# Patient Record
Sex: Female | Born: 1990
Health system: Southern US, Community
[De-identification: ages and names within clinical notes are randomized; demographics above are authoritative.]

## PROBLEM LIST (undated history)

## (undated) DIAGNOSIS — F419 Anxiety disorder, unspecified: Secondary | ICD-10-CM

## (undated) DIAGNOSIS — K219 Gastro-esophageal reflux disease without esophagitis: Secondary | ICD-10-CM

## (undated) DIAGNOSIS — N96 Recurrent pregnancy loss: Secondary | ICD-10-CM

## (undated) DIAGNOSIS — F32A Depression, unspecified: Secondary | ICD-10-CM

## (undated) DIAGNOSIS — Z8619 Personal history of other infectious and parasitic diseases: Secondary | ICD-10-CM

## (undated) DIAGNOSIS — N939 Abnormal uterine and vaginal bleeding, unspecified: Secondary | ICD-10-CM

## (undated) DIAGNOSIS — F329 Major depressive disorder, single episode, unspecified: Secondary | ICD-10-CM

## (undated) DIAGNOSIS — Z9109 Other allergy status, other than to drugs and biological substances: Secondary | ICD-10-CM

## (undated) DIAGNOSIS — F909 Attention-deficit hyperactivity disorder, unspecified type: Secondary | ICD-10-CM

## (undated) HISTORY — DX: Personal history of other infectious and parasitic diseases: Z86.19

## (undated) HISTORY — DX: Major depressive disorder, single episode, unspecified: F32.9

## (undated) HISTORY — DX: Other allergy status, other than to drugs and biological substances: Z91.09

## (undated) HISTORY — DX: Depression, unspecified: F32.A

## (undated) HISTORY — DX: Anxiety disorder, unspecified: F41.9

## (undated) HISTORY — DX: Recurrent pregnancy loss: N96

## (undated) HISTORY — DX: Attention-deficit hyperactivity disorder, unspecified type: F90.9

## (undated) HISTORY — DX: Gastro-esophageal reflux disease without esophagitis: K21.9

## (undated) HISTORY — PX: TONSILLECTOMY: SUR1361

## (undated) HISTORY — DX: Abnormal uterine and vaginal bleeding, unspecified: N93.9

---
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
## 2008-02-11 ENCOUNTER — Encounter: Payer: Self-pay | Admitting: Maternal & Fetal Medicine

## 2008-03-17 ENCOUNTER — Encounter: Payer: Self-pay | Admitting: Maternal & Fetal Medicine

## 2008-04-27 ENCOUNTER — Inpatient Hospital Stay: Payer: Self-pay | Admitting: Obstetrics and Gynecology

## 2008-05-21 ENCOUNTER — Other Ambulatory Visit: Payer: Self-pay

## 2008-05-21 ENCOUNTER — Emergency Department: Payer: Self-pay | Admitting: Emergency Medicine

## 2009-09-19 ENCOUNTER — Emergency Department: Payer: Self-pay | Admitting: Emergency Medicine

## 2009-11-29 ENCOUNTER — Emergency Department: Payer: Self-pay

## 2011-08-25 ENCOUNTER — Emergency Department: Payer: Self-pay | Admitting: Emergency Medicine

## 2015-02-11 ENCOUNTER — Emergency Department
Admit: 2015-02-11 | Disposition: A | Discharge status: Home / Self Care | Payer: Self-pay | Admitting: Emergency Medicine

## 2015-02-11 LAB — CBC
HCT: 38.3 % (ref 35.0–47.0)
HGB: 12.6 g/dL (ref 12.0–16.0)
MCH: 28.1 pg (ref 26.0–34.0)
MCHC: 33 g/dL (ref 32.0–36.0)
MCV: 85 fL (ref 80–100)
Platelet: 301 10*3/uL (ref 150–440)
RBC: 4.5 10*6/uL (ref 3.80–5.20)
RDW: 13 % (ref 11.5–14.5)
WBC: 9.7 10*3/uL (ref 3.6–11.0)

## 2015-02-11 LAB — HCG, QUANTITATIVE, PREGNANCY: Beta Hcg, Quant.: 8 m[IU]/mL — ABNORMAL HIGH

## 2015-06-29 ENCOUNTER — Ambulatory Visit (INDEPENDENT_AMBULATORY_CARE_PROVIDER_SITE_OTHER): Payer: Medicaid Other | Admitting: Obstetrics and Gynecology

## 2015-06-29 VITALS — BP 116/76 | HR 75 | Ht 66.0 in | Wt 230.4 lb

## 2015-06-29 DIAGNOSIS — Z331 Pregnant state, incidental: Secondary | ICD-10-CM

## 2015-06-29 DIAGNOSIS — N96 Recurrent pregnancy loss: Secondary | ICD-10-CM

## 2015-06-29 DIAGNOSIS — Z36 Encounter for antenatal screening of mother: Secondary | ICD-10-CM

## 2015-06-29 DIAGNOSIS — Z1389 Encounter for screening for other disorder: Secondary | ICD-10-CM

## 2015-06-29 DIAGNOSIS — K219 Gastro-esophageal reflux disease without esophagitis: Secondary | ICD-10-CM | POA: Insufficient documentation

## 2015-06-29 DIAGNOSIS — R110 Nausea: Secondary | ICD-10-CM

## 2015-06-29 DIAGNOSIS — N898 Other specified noninflammatory disorders of vagina: Secondary | ICD-10-CM

## 2015-06-29 DIAGNOSIS — Z113 Encounter for screening for infections with a predominantly sexual mode of transmission: Secondary | ICD-10-CM

## 2015-06-29 DIAGNOSIS — R638 Other symptoms and signs concerning food and fluid intake: Secondary | ICD-10-CM

## 2015-06-29 DIAGNOSIS — N939 Abnormal uterine and vaginal bleeding, unspecified: Secondary | ICD-10-CM

## 2015-06-29 DIAGNOSIS — Z369 Encounter for antenatal screening, unspecified: Secondary | ICD-10-CM

## 2015-06-29 DIAGNOSIS — Z8619 Personal history of other infectious and parasitic diseases: Secondary | ICD-10-CM

## 2015-06-29 MED ORDER — DOXYLAMINE-PYRIDOXINE 10-10 MG PO TBEC: DELAYED_RELEASE_TABLET | ORAL | Status: DC

## 2015-06-29 MED ORDER — CONCEPT DHA 53.5-38-1 MG PO CAPS
1.0000 | ORAL_CAPSULE | Freq: Every day | ORAL | Status: DC
Start: 2015-06-29 — End: 2015-07-16

## 2015-06-29 NOTE — Progress Notes (Signed)
Brandy Becker for NOB nurse interview visit. G-4.  P-1021. Pregnancy confirmation pt brought from [facility], UPT.  EDD 02/18/2016, EGA 6.4 wks.  Pregnancy eduction material explained and given. No cats in the home. NOB labs ordered. TSH/HbgA1c due to Increased BMI. HIV lab and Drug screen pt was aware she could opt out and did not decline. Drug screen ordered and sent to lab. PNV encouraged and ordered. Diclegis ordered for nausea, no bad as yet. NT to discuss with provider but would like genetic testing. Ultrasound ordered for tomorrow due pt having spotting x5 days, hx of miscarriages x2, and had ultrasound at [facility]. Also BHCG ordered due to aub.  Pt not sure but she thought no gestional sac was seen on 06/25/2015. Also pt states she had a glucose test and other labs but does not know what they were, that had to call the doctor and find out what she was supposed to get.  Pt to sign medical release but information will not be available when needed. Pt. To follow up with provider pending ultrasound results for NOB physical.  All questions answered. Pt's had +home pregnancy test (had been trying to conceive) and confirmed at [facility] on 06/18/2015. LMP 04/28/2015. Started spotting orange in color when wiped on 06/24/2015, 06/25/15 spotting and [place] for dating, 06/26/15 no bleeding, 06/27/15 bled thru panties, red, not dark red (at a cook out); 06/28/2015 spotted x2 and today.   ZIKA EXPOSURE SCREEN:  The patient has not traveled to a [place] Virus endemic area within the past 6 months, nor has she had unprotected sex with a partner who has travelled to a [place] endemic region within the past 6 months. The patient has been advised to notify us if these factors change any time during this current pregnancy, so adequate testing and monitoring can be initiated.

## 2015-06-30 ENCOUNTER — Encounter (INDEPENDENT_AMBULATORY_CARE_PROVIDER_SITE_OTHER): Payer: Self-pay

## 2015-06-30 ENCOUNTER — Ambulatory Visit: Payer: Medicaid Other

## 2015-06-30 ENCOUNTER — Other Ambulatory Visit: Payer: Medicaid Other

## 2015-06-30 ENCOUNTER — Other Ambulatory Visit: Payer: Self-pay | Admitting: Obstetrics and Gynecology

## 2015-06-30 DIAGNOSIS — N939 Abnormal uterine and vaginal bleeding, unspecified: Secondary | ICD-10-CM

## 2015-06-30 DIAGNOSIS — Z331 Pregnant state, incidental: Secondary | ICD-10-CM | POA: Diagnosis not present

## 2015-06-30 DIAGNOSIS — Z36 Encounter for antenatal screening of mother: Secondary | ICD-10-CM | POA: Diagnosis not present

## 2015-06-30 DIAGNOSIS — Z369 Encounter for antenatal screening, unspecified: Secondary | ICD-10-CM

## 2015-06-30 DIAGNOSIS — N898 Other specified noninflammatory disorders of vagina: Secondary | ICD-10-CM | POA: Diagnosis not present

## 2015-06-30 DIAGNOSIS — N96 Recurrent pregnancy loss: Secondary | ICD-10-CM

## 2015-06-30 NOTE — Progress Notes (Unsigned)
Patient ID: Brandy Becker, female   DOB: 08-10-1991, 24 y.o.   MRN: [id number] Pt informed that ultrasound indicated she was 5.3 wks today. Sac empty. She should me a picture of US done at [facility] which states she was 8.2 wks on 8/11/14/14 not 6 wks. As previously told. Dr. Valentino Saxon ordered another bhcg for Thurs. And f/u appt on Tuesday of next week. I will call pt will lab (bhcg) on Friday due to pt not being able to come until afternoon on thurs for lab. Pt states that vaginal spotting has not changed. Is having headaches. To take Tylenol ES as directed.  Contact number for pt.: [phone]

## 2015-07-01 LAB — HEMOGLOBIN A1C
Est. average glucose Bld gHb Est-mCnc: 114 mg/dL
HEMOGLOBIN A1C: 5.6 % (ref 4.8–5.6)

## 2015-07-01 LAB — CBC WITH DIFFERENTIAL/PLATELET
Basophils Absolute: 0.1 10*3/uL (ref 0.0–0.2)
Basos: 1 %
EOS (ABSOLUTE): 0.1 10*3/uL (ref 0.0–0.4)
EOS: 1 %
HEMATOCRIT: 37.3 % (ref 34.0–46.6)
Hemoglobin: 12.5 g/dL (ref 11.1–15.9)
IMMATURE GRANULOCYTES: 0 %
Immature Grans (Abs): 0 10*3/uL (ref 0.0–0.1)
Lymphocytes Absolute: 2.9 10*3/uL (ref 0.7–3.1)
Lymphs: 36 %
MCH: 28.6 pg (ref 26.6–33.0)
MCHC: 33.5 g/dL (ref 31.5–35.7)
MCV: 85 fL (ref 79–97)
Monocytes Absolute: 0.4 10*3/uL (ref 0.1–0.9)
Monocytes: 5 %
Neutrophils Absolute: 4.6 10*3/uL (ref 1.4–7.0)
Neutrophils: 57 %
PLATELETS: 310 10*3/uL (ref 150–379)
RBC: 4.37 x10E6/uL (ref 3.77–5.28)
RDW: 13.4 % (ref 12.3–15.4)
WBC: 8 10*3/uL (ref 3.4–10.8)

## 2015-07-01 LAB — URINALYSIS, ROUTINE W REFLEX MICROSCOPIC
Bilirubin, UA: NEGATIVE
GLUCOSE, UA: NEGATIVE
Ketones, UA: NEGATIVE
Leukocytes, UA: NEGATIVE
Nitrite, UA: NEGATIVE
PROTEIN UA: NEGATIVE
RBC, UA: NEGATIVE
SPEC GRAV UA: 1.027 (ref 1.005–1.030)
UUROB: 0.2 mg/dL (ref 0.2–1.0)
pH, UA: 6 (ref 5.0–7.5)

## 2015-07-01 LAB — URINE CULTURE: ORGANISM ID, BACTERIA: NO GROWTH

## 2015-07-01 LAB — VARICELLA ZOSTER ANTIBODY, IGM: Varicella IgM: 1.14 index — ABNORMAL HIGH (ref 0.00–0.90)

## 2015-07-01 LAB — TSH: TSH: 1.9 u[IU]/mL (ref 0.450–4.500)

## 2015-07-01 LAB — GC/CHLAMYDIA PROBE AMP
Chlamydia trachomatis, NAA: NEGATIVE
Neisseria gonorrhoeae by PCR: NEGATIVE

## 2015-07-01 LAB — HIV ANTIBODY (ROUTINE TESTING W REFLEX): HIV SCREEN 4TH GENERATION: NONREACTIVE

## 2015-07-01 LAB — HEPATITIS B SURFACE ANTIGEN: Hepatitis B Surface Ag: NEGATIVE

## 2015-07-01 LAB — ANTIBODY SCREEN: ANTIBODY SCREEN: NEGATIVE

## 2015-07-01 LAB — RH TYPE: Rh Factor: POSITIVE

## 2015-07-01 LAB — ABO

## 2015-07-01 LAB — RPR: RPR Ser Ql: NONREACTIVE

## 2015-07-01 LAB — RUBELLA ANTIBODY, IGM

## 2015-07-02 ENCOUNTER — Other Ambulatory Visit: Payer: Self-pay | Admitting: Obstetrics and Gynecology

## 2015-07-02 ENCOUNTER — Other Ambulatory Visit: Payer: Self-pay

## 2015-07-02 ENCOUNTER — Other Ambulatory Visit: Payer: Medicaid Other

## 2015-07-02 DIAGNOSIS — N939 Abnormal uterine and vaginal bleeding, unspecified: Secondary | ICD-10-CM

## 2015-07-02 DIAGNOSIS — N96 Recurrent pregnancy loss: Secondary | ICD-10-CM

## 2015-07-02 DIAGNOSIS — Z331 Pregnant state, incidental: Secondary | ICD-10-CM

## 2015-07-02 LAB — BETA HCG QUANT (REF LAB): hCG Quant: 10919 m[IU]/mL

## 2015-07-02 LAB — SPECIMEN STATUS REPORT

## 2015-07-03 ENCOUNTER — Telehealth: Payer: Self-pay

## 2015-07-03 LAB — BETA HCG QUANT (REF LAB): hCG Quant: 7838 m[IU]/mL

## 2015-07-03 NOTE — Telephone Encounter (Signed)
-----   Message from Anika Cherry, MD sent at 07/03/2015 12:42 PM EDT ----- Please inform patient that her BHCG levels are declining.  Not a normal pregnancy.  Needs to see Dr. D for further discussion of management options. 

## 2015-07-03 NOTE — Telephone Encounter (Signed)
Pt aware. Appt made 8/31 at 11:30.

## 2015-07-03 NOTE — Telephone Encounter (Signed)
-----   Message from Hildred Laser, MD sent at 07/03/2015 12:42 PM EDT ----- Please inform patient that her BHCG levels are declining.  Not a normal pregnancy.  Needs to see Dr. Algis Downs for further discussion of management options.

## 2015-07-03 NOTE — Telephone Encounter (Signed)
Mailbox full, unable to leave msg to contact office.

## 2015-07-07 ENCOUNTER — Encounter: Payer: Self-pay | Admitting: Obstetrics and Gynecology

## 2015-07-07 ENCOUNTER — Ambulatory Visit (INDEPENDENT_AMBULATORY_CARE_PROVIDER_SITE_OTHER): Payer: Medicaid Other | Admitting: Obstetrics and Gynecology

## 2015-07-07 VITALS — BP 117/73 | HR 77 | Ht 65.0 in | Wt 227.2 lb

## 2015-07-07 DIAGNOSIS — N96 Recurrent pregnancy loss: Secondary | ICD-10-CM | POA: Diagnosis not present

## 2015-07-07 DIAGNOSIS — O200 Threatened abortion: Secondary | ICD-10-CM

## 2015-07-07 NOTE — Progress Notes (Signed)
Chief complaint: 1.  Abnormal first trimester pregnancy.  Patient is a 24 year old gravida 4, para 0121 at 7.[redacted] weeks gestation who presents for follow-up regarding possible an- embryonic gestation. Early ultrasound on 06/25/2015 at [facility] demonstrated an early gestational sac without fetal pole. Subsequent follow-up ultrasound here on 06/30/2015.  Likewise demonstrated a gestational sac without fetal pole or yolk sac. Quantitative ACG titers are on a downward trend. Patient is experiencing mild cramping and spotting without passage of tissue.  IMPRESSION: 1.  Threatened AB, probable an embryonic gestation.  PLAN: 1.  Repeat ultrasound to confirm findings. 2.  If there is no development of an intrauterine pregnancy on ultrasound, definitive management will be performed.  Patient is requesting D&C if the pregnancy is nonviable.  A total of 15 minutes were spent face-to-face with the patient during this encounter and over half of that time dealt with counseling and coordination of care.  [name], MD

## 2015-07-08 ENCOUNTER — Ambulatory Visit: Payer: Medicaid Other | Admitting: Anesthesiology

## 2015-07-08 ENCOUNTER — Ambulatory Visit
Admission: AD | Admit: 2015-07-08 | Discharge: 2015-07-08 | Disposition: A | Discharge status: Home / Self Care | Payer: Medicaid Other | Source: Ambulatory Visit | Attending: Obstetrics and Gynecology | Admitting: Obstetrics and Gynecology

## 2015-07-08 ENCOUNTER — Ambulatory Visit: Payer: Medicaid Other

## 2015-07-08 ENCOUNTER — Encounter: Payer: Self-pay | Admitting: *Deleted

## 2015-07-08 ENCOUNTER — Encounter
Admission: AD | Disposition: A | Discharge status: Home / Self Care | Payer: Self-pay | Source: Ambulatory Visit | Attending: Obstetrics and Gynecology

## 2015-07-08 DIAGNOSIS — Z3A08 8 weeks gestation of pregnancy: Secondary | ICD-10-CM | POA: Diagnosis not present

## 2015-07-08 DIAGNOSIS — Z87891 Personal history of nicotine dependence: Secondary | ICD-10-CM | POA: Diagnosis not present

## 2015-07-08 DIAGNOSIS — K219 Gastro-esophageal reflux disease without esophagitis: Secondary | ICD-10-CM | POA: Insufficient documentation

## 2015-07-08 DIAGNOSIS — O021 Missed abortion: Secondary | ICD-10-CM | POA: Diagnosis not present

## 2015-07-08 DIAGNOSIS — O200 Threatened abortion: Secondary | ICD-10-CM | POA: Diagnosis not present

## 2015-07-08 HISTORY — PX: DILATION AND EVACUATION: SHX1459

## 2015-07-08 LAB — CBC WITH DIFFERENTIAL/PLATELET
BASOS ABS: 0.1 10*3/uL (ref 0–0.1)
BASOS PCT: 1 %
Eosinophils Absolute: 0.1 10*3/uL (ref 0–0.7)
Eosinophils Relative: 1 %
HEMATOCRIT: 38.3 % (ref 35.0–47.0)
HEMOGLOBIN: 12.5 g/dL (ref 12.0–16.0)
Lymphocytes Relative: 26 %
Lymphs Abs: 2.8 10*3/uL (ref 1.0–3.6)
MCH: 27.7 pg (ref 26.0–34.0)
MCHC: 32.6 g/dL (ref 32.0–36.0)
MCV: 84.9 fL (ref 80.0–100.0)
MONOS PCT: 6 %
Monocytes Absolute: 0.7 10*3/uL (ref 0.2–0.9)
NEUTROS ABS: 7.3 10*3/uL — AB (ref 1.4–6.5)
NEUTROS PCT: 66 %
Platelets: 290 10*3/uL (ref 150–440)
RBC: 4.51 MIL/uL (ref 3.80–5.20)
RDW: 13.8 % (ref 11.5–14.5)
WBC: 10.9 10*3/uL (ref 3.6–11.0)

## 2015-07-08 LAB — TYPE AND SCREEN
ABO/RH(D): O POS
Antibody Screen: NEGATIVE

## 2015-07-08 LAB — RAPID HIV SCREEN (HIV 1/2 AB+AG)
HIV 1/2 ANTIBODIES: NONREACTIVE
HIV-1 P24 Antigen - HIV24: NONREACTIVE

## 2015-07-08 SURGERY — DILATION AND EVACUATION, UTERUS
Anesthesia: General

## 2015-07-08 MED ORDER — LACTATED RINGERS IV SOLN: INTRAVENOUS | Status: DC

## 2015-07-08 MED ORDER — PROPOFOL 10 MG/ML IV BOLUS
INTRAVENOUS | Status: DC | PRN
  Administered 2015-07-08: 170 mg via INTRAVENOUS

## 2015-07-08 MED ORDER — ONDANSETRON HCL 4 MG/2ML IJ SOLN
4.0000 mg | Freq: Once | INTRAMUSCULAR | Status: AC | PRN
  Administered 2015-07-08: 4 mg via INTRAVENOUS

## 2015-07-08 MED ORDER — MIDAZOLAM HCL 2 MG/2ML IJ SOLN
INTRAMUSCULAR | Status: DC | PRN
  Administered 2015-07-08: 2 mg via INTRAVENOUS

## 2015-07-08 MED ORDER — LACTATED RINGERS IV SOLN
INTRAVENOUS | Status: DC
  Administered 2015-07-08: 19:00:00 via INTRAVENOUS

## 2015-07-08 MED ORDER — FENTANYL CITRATE (PF) 100 MCG/2ML IJ SOLN
25.0000 ug | INTRAMUSCULAR | Status: DC | PRN
  Administered 2015-07-08 (×4): 25 ug via INTRAVENOUS

## 2015-07-08 MED ORDER — FENTANYL CITRATE (PF) 100 MCG/2ML IJ SOLN
INTRAMUSCULAR | Status: DC | PRN
  Administered 2015-07-08: 100 ug via INTRAVENOUS

## 2015-07-08 MED ORDER — LIDOCAINE HCL (CARDIAC) 20 MG/ML IV SOLN
INTRAVENOUS | Status: DC | PRN
  Administered 2015-07-08: 30 mg via INTRAVENOUS

## 2015-07-08 MED ORDER — IBUPROFEN 800 MG PO TABS: 800.0000 mg | ORAL_TABLET | Freq: Three times a day (TID) | ORAL | Status: DC

## 2015-07-08 MED ORDER — DEXAMETHASONE SODIUM PHOSPHATE 4 MG/ML IJ SOLN
INTRAMUSCULAR | Status: DC | PRN
  Administered 2015-07-08: 4 mg via INTRAVENOUS

## 2015-07-08 MED ORDER — FENTANYL CITRATE (PF) 100 MCG/2ML IJ SOLN
INTRAMUSCULAR | Status: AC
  Filled 2015-07-08: qty 2

## 2015-07-08 MED ORDER — OXYCODONE-ACETAMINOPHEN 5-325 MG PO TABS: 1.0000 | ORAL_TABLET | ORAL | Status: DC | PRN

## 2015-07-08 MED ORDER — SUCCINYLCHOLINE CHLORIDE 20 MG/ML IJ SOLN
INTRAMUSCULAR | Status: DC | PRN
Start: 2015-07-08 — End: 2015-07-08
  Administered 2015-07-08: 100 mg via INTRAVENOUS

## 2015-07-08 SURGICAL SUPPLY — 19 items
CATH ROBINSON RED A/P 16FR (CATHETERS) ×2 IMPLANT
DRSG TELFA 3X8 NADH (GAUZE/BANDAGES/DRESSINGS) IMPLANT
FILTER UTR ASPR SPEC (MISCELLANEOUS) ×1 IMPLANT
FLTR UTR ASPR SPEC (MISCELLANEOUS) ×2
GLOVE BIO SURGEON STRL SZ8 (GLOVE) ×2 IMPLANT
GOWN STRL REUS W/ TWL LRG LVL3 (GOWN DISPOSABLE) ×1 IMPLANT
GOWN STRL REUS W/TWL LRG LVL3 (GOWN DISPOSABLE) ×1
KIT BERKELEY 1ST TRIMESTER 3/8 (MISCELLANEOUS) ×2 IMPLANT
KIT RM TURNOVER CYSTO AR (KITS) ×2 IMPLANT
PACK DNC HYST (MISCELLANEOUS) ×2 IMPLANT
PAD OB MATERNITY 4.3X12.25 (PERSONAL CARE ITEMS) ×2 IMPLANT
PAD PREP 24X41 OB/GYN DISP (PERSONAL CARE ITEMS) ×2 IMPLANT
SET BERKELEY SUCTION TUBING (SUCTIONS) ×2 IMPLANT
SOL PREP PVP 2OZ (MISCELLANEOUS) ×2
SOLUTION PREP PVP 2OZ (MISCELLANEOUS) ×1 IMPLANT
SPONGE XRAY 4X4 16PLY STRL (MISCELLANEOUS) ×2 IMPLANT
TOWEL OR 17X26 4PK STRL BLUE (TOWEL DISPOSABLE) ×2 IMPLANT
VACURETTE 10 RIGID CVD (CANNULA) ×2 IMPLANT
VACURETTE 8 RIGID CVD (CANNULA) IMPLANT

## 2015-07-08 NOTE — Progress Notes (Signed)
Chief complaint: 1.  Abnormal first trimester pregnancy.  Patient is a 24 year old gravida 4, para 0121 at 7.[redacted] weeks gestation who presents for follow-up regarding possible an- embryonic gestation. Early ultrasound on 06/25/2015 at [facility] demonstrated an early gestational sac without fetal pole. Subsequent follow-up ultrasound here on 06/30/2015.  Likewise demonstrated a gestational sac without fetal pole or yolk sac. Quantitative ACG titers are on a downward trend. Patient is experiencing mild cramping and spotting without passage of tissue.  PE: Well appearing female in NAD Oropharynx clear Lungs clear Heart RRR without Murmur Abd soft, nontender Pelvic Deferred to OR Extremeties w/o CCE Skin no rash  IMPRESSION: 1.  Threatened AB, probable an embryonic gestation.  PLAN: 1.  Repeat ultrasound to confirm findings. 2.  If there is no development of an intrauterine pregnancy on ultrasound, definitive management will be performed.  Patient is requesting D&C if the pregnancy is nonviable.  A total of 15 minutes were spent face-to-face with the patient during this encounter and over half of that time dealt with counseling and coordination of care.  [name], MD   Date of Initial H&P: 07/07/2015  History reviewed, patient examined, no change in status, stable for surgery. Repeat U/S confirms Missed AB with no evidence of viable pregnancy.  Pre op Counseling: completed. All risks of surgery are accepted. Informed consent is given.

## 2015-07-08 NOTE — Anesthesia Postprocedure Evaluation (Signed)
  Anesthesia Post-op Note  Patient: Corporate treasurer  Procedure(s) Performed: Procedure(s): DILATATION AND EVACUATION (N/A)  Anesthesia type:General  Patient location: PACU  Post pain: Pain level controlled  Post assessment: Post-op Vital signs reviewed, Patient's Cardiovascular Status Stable, Respiratory Function Stable, Patent Airway and No signs of Nausea or vomiting  Post vital signs: Reviewed and stable  Last Vitals:  Filed Vitals:   07/08/15 1927  BP: 144/62  Pulse:   Temp: 37.3 C  Resp:     Level of consciousness: awake, alert  and patient cooperative  Complications: No apparent anesthesia complications

## 2015-07-08 NOTE — Transfer of Care (Signed)
Immediate Anesthesia Transfer of Care Note  Patient: Brandy Becker  Procedure(s) Performed: Procedure(s): DILATATION AND EVACUATION (N/A)  Patient Location: PACU  Anesthesia Type:General  Level of Consciousness: awake, alert , oriented and patient cooperative  Airway & Oxygen Therapy: Patient Spontanous Breathing and Patient connected to face mask oxygen  Post-op Assessment: Report given to RN and Post -op Vital signs reviewed and stable  Post vital signs: Reviewed and stable  Last Vitals:  Filed Vitals:   07/08/15 1459  BP: 127/40  Pulse: 79  Temp: 36.9 C  Resp: 16    Complications: No apparent anesthesia complications

## 2015-07-08 NOTE — Anesthesia Procedure Notes (Signed)
Procedure Name: Intubation Date/Time: 07/08/2015 6:51 PM Performed by: Waldo Laine Pre-anesthesia Checklist: Patient identified, Emergency Drugs available, Suction available, Patient being monitored and Timeout performed Patient Re-evaluated:Patient Re-evaluated prior to inductionOxygen Delivery Method: Circle system utilized Preoxygenation: Pre-oxygenation with 100% oxygen Intubation Type: IV induction, Rapid sequence and Cricoid Pressure applied Laryngoscope Size: Miller and 2 Grade View: Grade I Tube type: Oral Number of attempts: 1 Airway Equipment and Method: Stylet Placement Confirmation: ETT inserted through vocal cords under direct vision,  positive ETCO2 and breath sounds checked- equal and bilateral Secured at: 21 cm Dental Injury: Teeth and Oropharynx as per pre-operative assessment

## 2015-07-08 NOTE — Anesthesia Preprocedure Evaluation (Addendum)
Anesthesia Evaluation  Patient identified by MRN, date of birth, ID band Patient awake    Reviewed: Allergy & Precautions, H&P , NPO status , Patient's Chart, lab work & pertinent test results, reviewed documented beta blocker date and time   History of Anesthesia Complications Negative for: history of anesthetic complications  Airway Mallampati: II  TM Distance: >3 FB Neck ROM: full    Dental no notable dental hx.    Pulmonary former smoker,  breath sounds clear to auscultation  Pulmonary exam normal       Cardiovascular Exercise Tolerance: Good negative cardio ROS Normal cardiovascular examRhythm:regular Rate:Normal     Neuro/Psych negative neurological ROS  negative psych ROS   GI/Hepatic Neg liver ROS, GERD-  ,  Endo/Other  Morbid obesity  Renal/GU negative Renal ROS  negative genitourinary   Musculoskeletal   Abdominal   Peds  Hematology negative hematology ROS (+)   Anesthesia Other Findings Past Medical History:   Hx of herpes genitalis                                       History of chlamydia                                         GERD (gastroesophageal reflux disease)                       History of multiple miscarriages                               Comment:x2   Vaginal spotting             Patient last ate at 10:30am, so we will delay until 6:30pm.                                    Comment:currently pregnant   Reproductive/Obstetrics (+) Pregnancy                            Anesthesia Physical Anesthesia Plan  ASA: II  Anesthesia Plan: General   Post-op Pain Management:    Induction:   Airway Management Planned: Oral ETT  Additional Equipment:   Intra-op Plan:   Post-operative Plan:   Informed Consent: I have reviewed the patients History and Physical, chart, labs and discussed the procedure including the risks, benefits and alternatives for the proposed  anesthesia with the patient or authorized representative who has indicated his/her understanding and acceptance.   Dental Advisory Given  Plan Discussed with: Anesthesiologist, CRNA and Surgeon  Anesthesia Plan Comments:        Anesthesia Quick Evaluation

## 2015-07-08 NOTE — Op Note (Signed)
Brandy Becker PROCEDURE DATE: 07/08/2015 7:08 PM  PREOPERATIVE DIAGNOSIS: MISSED ABORTION POSTOPERATIVE DIAGNOSIS: Same PROCEDURE: Procedure(s): DILATATION AND EVACUATION (N/A) SURGEON:  Dr. Daphine Deutscher A Scotti Motter ASSISTANT: none ANESTHESIA: General  INDICATIONS: 24 y.o. [id number] with history ofMissed AB at [redacted] weeks gestation presents for surgical management.   Please see preoperative notes for further details.   FINDINGS:  Uterus wasMidplane and 8IVF 500 mL; Urine 200 mL; EBL 50 mL week size. Tissue consistent with products of conception was removed and sent to Pathology.   I/O's:  IVF 500 mL; Urine 200 mL; EBL 50 mL SPECIMENS: POC  COMPLICATIONS: None immediate COUNTS:  YES  PROCEDURE IN DETAIL: The patient was brought to the operating room where she was placed into the supine position.  General LMA anesthesia was induced without incident. She was placed in the dorsal lithotomy position using candy cane stirrups, and was examined; A Betadine prep and drape was performed in sterile fashion.   A  Red Robinson catheter was used to drain the bladder of urine. A weighted speculum was placed into  the vagina and a single tooth tenaculum was applied to the anterior lip of the cervix. The cervix was gently dilated using Hank's Dilators to accommodate a 10 Fr suction curette, that was gently advanced to the uterine fundus.  The suction device was then activated and the curette was slowly rotated to clear the uterine cavity of products of conception.  A sharp curettage with a serrated curette  was then performed to confirm complete emptying of the uterus. Minimal bleeding was encountered. The tenaculum was removed along with all instruments  from the  vagina.   The patient was awakened, mobilized and taken to the recovery room in satisfactory condition.  The procedure was well-tolerated.  The patient will be discharged to home as per PACU criteria.  Routine postoperative instructions were given along  with a prescription for analgesics.  She will follow up in the clinic in 1-2 weeks for postoperative evaluation.  [name], MD [facility]

## 2015-07-08 NOTE — H&P (Signed)
Progress Notes   Brandy Becker (MR# [id number])      Progress Notes Info    Author Note Status Last Update User Last Update Date/Time   [name], MD Deleted [name], MD 07/08/2015 4:01 PM    Progress Notes    Expand All Collapse All   Chief complaint: 1. Abnormal first trimester pregnancy.  Patient is a 24 year old gravida 4, para 0121 at 7.[redacted] weeks gestation who presents for follow-up regarding possible an- embryonic gestation. Early ultrasound on 06/25/2015 at [facility] demonstrated an early gestational sac without fetal pole. Subsequent follow-up ultrasound here on 06/30/2015. Likewise demonstrated a gestational sac without fetal pole or yolk sac. Quantitative ACG titers are on a downward trend. Patient is experiencing mild cramping and spotting without passage of tissue.  PE: Well appearing female in NAD Oropharynx clear Lungs clear Heart RRR without Murmur Abd soft, nontender Pelvic Deferred to OR Extremeties w/o CCE Skin no rash  IMPRESSION: 1. Threatened AB, probable an embryonic gestation.  PLAN: 1. Repeat ultrasound to confirm findings. 2. If there is no development of an intrauterine pregnancy on ultrasound, definitive management will be performed. Patient is requesting D&C if the pregnancy is nonviable.  A total of 15 minutes were spent face-to-face with the patient during this encounter and over half of that time dealt with counseling and coordination of care.  [name], MD   Date of Initial H&P: 07/07/2015  History reviewed, patient examined, no change in status, stable for surgery. Repeat U/S confirms Missed AB with no evidence of viable pregnancy.  Pre op Counseling: completed. All risks of surgery are accepted. Informed consent is given.  [name], MD

## 2015-07-08 NOTE — Progress Notes (Deleted)
Chief complaint: 1.  Abnormal first trimester pregnancy.  Patient is a 24 year old gravida 4, para 0121 at 7.[redacted] weeks gestation who presents for follow-up regarding possible an- embryonic gestation. Early ultrasound on 06/25/2015 at [facility] demonstrated an early gestational sac without fetal pole. Subsequent follow-up ultrasound here on 06/30/2015.  Likewise demonstrated a gestational sac without fetal pole or yolk sac. Quantitative ACG titers are on a downward trend. Patient is experiencing mild cramping and spotting without passage of tissue.  PE: Well appearing female in NAD Oropharynx clear Lungs clear Heart RRR without Murmur Abd soft, nontender Pelvic Deferred to OR Extremeties w/o CCE Skin no rash  IMPRESSION: 1.  Threatened AB, probable an embryonic gestation.  PLAN: 1.  Repeat ultrasound to confirm findings. 2.  If there is no development of an intrauterine pregnancy on ultrasound, definitive management will be performed.  Patient is requesting D&C if the pregnancy is nonviable.  A total of 15 minutes were spent face-to-face with the patient during this encounter and over half of that time dealt with counseling and coordination of care.  [name], MD   Date of Initial H&P: 07/07/2015  History reviewed, patient examined, no change in status, stable for surgery. Repeat U/S confirms Missed AB with no evidence of viable pregnancy.  Pre op Counseling: completed. All risks of surgery are accepted. Informed consent is given.  [name], MD

## 2015-07-09 ENCOUNTER — Encounter: Payer: Self-pay | Admitting: Obstetrics and Gynecology

## 2015-07-09 LAB — RPR: RPR Ser Ql: NONREACTIVE

## 2015-07-09 LAB — ABO/RH: ABO/RH(D): O POS

## 2015-07-10 LAB — SURGICAL PATHOLOGY

## 2015-07-16 ENCOUNTER — Ambulatory Visit (INDEPENDENT_AMBULATORY_CARE_PROVIDER_SITE_OTHER): Payer: Medicaid Other | Admitting: Obstetrics and Gynecology

## 2015-07-16 ENCOUNTER — Encounter: Payer: Self-pay | Admitting: Obstetrics and Gynecology

## 2015-07-16 VITALS — BP 114/74 | HR 76 | Ht 66.0 in | Wt 228.5 lb

## 2015-07-16 DIAGNOSIS — M62830 Muscle spasm of back: Secondary | ICD-10-CM

## 2015-07-16 DIAGNOSIS — N96 Recurrent pregnancy loss: Secondary | ICD-10-CM

## 2015-07-16 DIAGNOSIS — Z09 Encounter for follow-up examination after completed treatment for conditions other than malignant neoplasm: Secondary | ICD-10-CM

## 2015-07-16 MED ORDER — DIAZEPAM 5 MG PO TABS: 5.0000 mg | ORAL_TABLET | Freq: Two times a day (BID) | ORAL | Status: DC | PRN

## 2015-07-16 NOTE — Progress Notes (Signed)
Patient ID: Almira Phetteplace, female   DOB: 1990-12-13, 24 y.o.   MRN: [id number]   Chief complaint: 1.  One week postop check. 2.  Status post suction D&C for incomplete AB   S/p dilation and evacuation- 8/31 Taking ibup 800 q8h -having neck and back pain No fever, chills, sweats. vb- lite Used all narcotics Wants nexplanon- asap  Pathology: Decidua with chorionic villi.  OBJECTIVE: BP 114/74 mmHg  Pulse 76  Ht  (1.676 m)  Wt 228 lb 8 oz (103.647 kg)  BMI 36.90 kg/m2  LMP 04/28/2015 (Exact Date) Alert, oriented female in no acute distress. Limited range of motion of neck due to paraspinous muscle spasm.  No nuchal rigidity.  IMPRESSION: 1 week postop check status post suction D&C for incomplete AB. 2.  Paraspinous muscle spasm in cervical and thoracic vertebrae region.  PLAN: 1.  Continue with ibuprofen 800 mg 3 times a day 2.  Valium 5 mg by mouth twice a day when necessary 1 week. 3.  Return for appointment with Yolanda Bonine for Nexplanon insertion.  [name], MD

## 2015-07-22 ENCOUNTER — Encounter: Payer: Self-pay | Admitting: Obstetrics and Gynecology

## 2015-07-22 ENCOUNTER — Ambulatory Visit (INDEPENDENT_AMBULATORY_CARE_PROVIDER_SITE_OTHER): Payer: BLUE CROSS/BLUE SHIELD | Admitting: Obstetrics and Gynecology

## 2015-07-22 VITALS — BP 118/74 | HR 93 | Wt 225.8 lb

## 2015-07-22 DIAGNOSIS — Z30018 Encounter for initial prescription of other contraceptives: Secondary | ICD-10-CM

## 2015-07-22 DIAGNOSIS — Z30019 Encounter for initial prescription of contraceptives, unspecified: Secondary | ICD-10-CM | POA: Diagnosis not present

## 2015-07-22 LAB — POCT URINE PREGNANCY: PREG TEST UR: NEGATIVE

## 2015-07-22 MED ORDER — ETONOGESTREL 68 MG ~~LOC~~ IMPL: 1.0000 | DRUG_IMPLANT | Freq: Once | SUBCUTANEOUS | Status: DC

## 2015-07-22 NOTE — Progress Notes (Signed)
Patient ID: Brandy Becker, female   DOB: [birth year], 24 y.o.   MRN: [id number]   Brandy Becker is a 24 y.o. year old African American female here for Nexplanon insertion.  Patient's last menstrual period was 04/28/2015 (exact date)., last sexual intercourse was NA- s/p SAB, and her pregnancy test today was negative.  Risks/benefits/side effects of Nexplanon have been discussed and her questions have been answered.  Specifically, a failure rate of 11/998 has been reported, with an increased failure rate if pt takes St. John's Wort and/or antiseizure medicaitons.  Brandy Becker is aware of the common side effect of irregular bleeding, which the incidence of decreases over time.  BP 118/74 mmHg  Pulse 93  Wt 225 lb 12.8 oz (102.422 kg)  LMP 04/28/2015 (Exact Date)  Breastfeeding? Unknown  Results for orders placed or performed in visit on 07/22/15 (from the past 24 hour(s))  POCT urine pregnancy   Collection Time: 07/22/15  3:49 PM  Result Value Ref Range   Preg Test, Ur Negative Negative     She is right-handed, so her left arm, approximately 4 inches proximal from the elbow, was cleansed with alcohol and anesthetized with 2cc of 2% Lidocaine.  The area was cleansed again with betadine and the Nexplanon was inserted per manufacturer's recommendations without difficulty.  A steri-strip and pressure bandage were applied.  Pt was instructed to keep the area clean and dry, remove pressure bandage in 24 hours, and keep insertion site covered with the steri-strip for 3-5 days.  Back up contraception was recommended for 2 weeks.  She was given a card indicating date Nexplanon was inserted and date it needs to be removed. Follow-up PRN problems.  Brandy Becker,CNM

## 2015-09-14 ENCOUNTER — Emergency Department: Payer: Medicaid Other

## 2015-09-14 ENCOUNTER — Encounter: Payer: Self-pay | Admitting: Emergency Medicine

## 2015-09-14 ENCOUNTER — Emergency Department
Admission: EM | Admit: 2015-09-14 | Discharge: 2015-09-14 | Disposition: A | Discharge status: Home / Self Care | Payer: Medicaid Other | Attending: Emergency Medicine | Admitting: Emergency Medicine

## 2015-09-14 DIAGNOSIS — J189 Pneumonia, unspecified organism: Secondary | ICD-10-CM

## 2015-09-14 DIAGNOSIS — H9193 Unspecified hearing loss, bilateral: Secondary | ICD-10-CM | POA: Diagnosis not present

## 2015-09-14 DIAGNOSIS — R05 Cough: Secondary | ICD-10-CM | POA: Diagnosis present

## 2015-09-14 DIAGNOSIS — Z87891 Personal history of nicotine dependence: Secondary | ICD-10-CM | POA: Insufficient documentation

## 2015-09-14 DIAGNOSIS — R197 Diarrhea, unspecified: Secondary | ICD-10-CM | POA: Insufficient documentation

## 2015-09-14 DIAGNOSIS — J159 Unspecified bacterial pneumonia: Secondary | ICD-10-CM | POA: Insufficient documentation

## 2015-09-14 DIAGNOSIS — Z79899 Other long term (current) drug therapy: Secondary | ICD-10-CM | POA: Diagnosis not present

## 2015-09-14 DIAGNOSIS — H9203 Otalgia, bilateral: Secondary | ICD-10-CM | POA: Diagnosis not present

## 2015-09-14 MED ORDER — KETOROLAC TROMETHAMINE 60 MG/2ML IM SOLN
60.0000 mg | Freq: Once | INTRAMUSCULAR | Status: AC
  Administered 2015-09-14: 60 mg via INTRAMUSCULAR
  Filled 2015-09-14: qty 2

## 2015-09-14 MED ORDER — AZITHROMYCIN 500 MG PO TABS: 500.0000 mg | ORAL_TABLET | Freq: Every day | ORAL | Status: DC

## 2015-09-14 MED ORDER — IBUPROFEN 800 MG PO TABS: 800.0000 mg | ORAL_TABLET | Freq: Three times a day (TID) | ORAL | Status: DC | PRN

## 2015-09-14 MED ORDER — TRAMADOL HCL 50 MG PO TABS: 50.0000 mg | ORAL_TABLET | Freq: Four times a day (QID) | ORAL | Status: DC | PRN

## 2015-09-14 MED ORDER — PSEUDOEPH-BROMPHEN-DM 30-2-10 MG/5ML PO SYRP: 5.0000 mL | ORAL_SOLUTION | Freq: Four times a day (QID) | ORAL | Status: DC | PRN

## 2015-09-14 MED ORDER — BENZONATATE 100 MG PO CAPS
200.0000 mg | ORAL_CAPSULE | Freq: Once | ORAL | Status: AC
  Administered 2015-09-14: 200 mg via ORAL
  Filled 2015-09-14: qty 2

## 2015-09-14 NOTE — ED Notes (Signed)
Pt to ed with c/o cough, congestion, fever sore throat x 4 days.  Pt states "my skin feels like it is burning and my whole body aches.

## 2015-09-14 NOTE — ED Provider Notes (Signed)
[facility] Emergency Department Provider Note  ____________________________________________  Time seen: Approximately 9:34 AM  I have reviewed the triage vital signs and the nursing notes.   HISTORY  Chief Complaint Cough and Fever    HPI Brandy Becker is a 24 y.o. female patient states 3 days of fever bodyaches. Patient states she took her temperature was over 100. Patient states she's having bilateral ear pain. Patient states she's had decreased hearing in both ears. Patient complaining of sore throat which increased with swallowing but able to tolerate fluids or fluid. Patient also complaining of diarrhea but denies nausea or vomiting. No palliative measures taken for this complaint. Patient rates her discomfort as a 10 over 10.  Past Medical History  Diagnosis Date  . Hx of herpes genitalis   . History of chlamydia   . GERD (gastroesophageal reflux disease)   . History of multiple miscarriages     x2  . Vaginal spotting     currently pregnant    Patient Active Problem List   Diagnosis Date Noted  . Vaginal spotting   . History of multiple miscarriages   . GERD (gastroesophageal reflux disease)   . Hx of herpes genitalis   . History of chlamydia     Past Surgical History  Procedure Laterality Date  . Cesarean section  2009  . Tonsillectomy    . Dilation and evacuation N/A 07/08/2015    Procedure: DILATATION AND EVACUATION;  Surgeon: [name], MD;  Location: [facility] ORS;  Service: Gynecology;  Laterality: N/A;    Current Outpatient Rx  Name  Route  Sig  Dispense  Refill  . azithromycin (ZITHROMAX) 500 MG tablet   Oral   Take 1 tablet (500 mg total) by mouth daily. Take 1 tablet daily for 3 days.   3 tablet   0   . brompheniramine-pseudoephedrine-DM 30-2-10 MG/5ML syrup   Oral   Take 5 mLs by mouth 4 (four) times daily as needed.   120 mL   0   . diazepam (VALIUM) 5 MG tablet   Oral   Take 1 tablet (5 mg total) by mouth  2 (two) times daily as needed for muscle spasms.   10 tablet   0   . etonogestrel (NEXPLANON) 68 MG IMPL implant   Subdermal   1 each (68 mg total) by Subdermal route once.   1 each   0   . ibuprofen (ADVIL,MOTRIN) 800 MG tablet   Oral   Take 1 tablet (800 mg total) by mouth 3 (three) times daily.   30 tablet   1   . ibuprofen (ADVIL,MOTRIN) 800 MG tablet   Oral   Take 1 tablet (800 mg total) by mouth every 8 (eight) hours as needed.   30 tablet   0   . traMADol (ULTRAM) 50 MG tablet   Oral   Take 1 tablet (50 mg total) by mouth every 6 (six) hours as needed for moderate pain.   12 tablet   0     Allergies Oxycodone  Family History  Problem Relation Age of Onset  . Esophageal cancer Maternal Grandfather   . Lung cancer      fob dad  . Seizures Son     grew out of them  . Hypertension Mother     Social History Social History  Substance Use Topics  . Smoking status: Former Games developer  . Smokeless tobacco: Never Used  . Alcohol Use: No    Review of  Systems Constitutional: Fever and body aches Eyes: No visual changes. ENT: No sore throat. Cardiovascular: Denies chest pain. Respiratory: Denies shortness of breath. Increased coughing Gastrointestinal: No abdominal pain.  No nausea, no vomiting. Diarrhea.  No constipation. Genitourinary: Negative for dysuria. Musculoskeletal: Negative for back pain. Skin: Negative for rash. Neurological: Negative for headaches, focal weakness or numbness. 10-point ROS otherwise negative.  ____________________________________________   PHYSICAL EXAM:  VITAL SIGNS: ED Triage Vitals  Enc Vitals Group     BP 09/14/15 0822 135/77 mmHg     Pulse Rate 09/14/15 0822 105     Resp 09/14/15 0822 20     Temp 09/14/15 0822 99.3 F (37.4 C)     Temp Source 09/14/15 0822 Oral     SpO2 09/14/15 0822 97 %     Weight 09/14/15 0822 225 lb (102.059 kg)     Height 09/14/15 0822 5\' 6"  (1.676 m)     Head Cir --      Peak Flow --       Pain Score 09/14/15 0823 10     Pain Loc --      Pain Edu? --      Excl. in GC? --     Constitutional: Alert and oriented. Well appearing and in no acute distress. Eyes: Conjunctivae are normal. PERRL. EOMI. Head: Atraumatic. Nose: Edematous nasal turbinates moderate guarding with palpation of frontal sinuses. Mouth/Throat: Mucous membranes are moist.  Oropharynx erythematous. Neck: No stridor.  No cervical spine tenderness to palpation. Hematological/Lymphatic/Immunilogical: No cervical lymphadenopathy. Cardiovascular: Normal rate, regular rhythm. Grossly normal heart sounds.  Good peripheral circulation. Respiratory: Normal respiratory effort.  No retractions. Lungs CTAB. Gastrointestinal: Soft and nontender. No distention. No abdominal bruits. No CVA tenderness. Musculoskeletal: No lower extremity tenderness nor edema.  No joint effusions. Neurologic:  Normal speech and language. No gross focal neurologic deficits are appreciated. No gait instability. Skin:  Skin is warm, dry and intact. No rash noted. Psychiatric: Mood and affect are normal. Speech and behavior are normal.  ____________________________________________   LABS (all labs ordered are listed, but only abnormal results are displayed)  Labs Reviewed - No data to display ____________________________________________  EKG   ____________________________________________  RADIOLOGY  Findings consistent with upper lobe pneumonia. I, Joni Reiningonald K Burech Mcfarland, personally viewed and evaluated these images (plain radiographs) as part of my medical decision making.   ____________________________________________   PROCEDURES  Procedure(s) performed: None  Critical Care performed: No  ____________________________________________   INITIAL IMPRESSION / ASSESSMENT AND PLAN / ED COURSE  Pertinent labs & imaging results that were available during my care of the patient were reviewed by me and considered in my medical decision  making (see chart for details).  Left upper lobe pneumonia. Discussed findings of the x-ray with patient. We'll start patient on Zithromax, round fell DM, and ibuprofen. Patient given a work note for 2 days. Patient advised follow-up with "clinic is no improvement return back to ER if condition worsens. ____________________________________________   FINAL CLINICAL IMPRESSION(S) / ED DIAGNOSES  Final diagnoses:  Community acquired pneumonia      Joni ReiningRonald K Oryan Winterton, PA-C 09/14/15 1017  Sharman CheekPhillip Stafford, MD 09/15/15 [phone]

## 2015-09-14 NOTE — ED Notes (Signed)
States she has had body aches with fever and cough since Friday . States fever went up to 103 over the weekend.Lungs clear at present

## 2016-02-04 ENCOUNTER — Ambulatory Visit: Payer: Medicaid Other | Admitting: Obstetrics and Gynecology

## 2016-03-24 ENCOUNTER — Emergency Department
Admission: EM | Admit: 2016-03-24 | Discharge: 2016-03-24 | Disposition: A | Discharge status: Home / Self Care | Payer: Medicaid Other | Attending: Emergency Medicine | Admitting: Emergency Medicine

## 2016-03-24 DIAGNOSIS — Z87891 Personal history of nicotine dependence: Secondary | ICD-10-CM | POA: Diagnosis not present

## 2016-03-24 DIAGNOSIS — Z792 Long term (current) use of antibiotics: Secondary | ICD-10-CM | POA: Diagnosis not present

## 2016-03-24 DIAGNOSIS — Z79899 Other long term (current) drug therapy: Secondary | ICD-10-CM | POA: Diagnosis not present

## 2016-03-24 DIAGNOSIS — J029 Acute pharyngitis, unspecified: Secondary | ICD-10-CM | POA: Diagnosis present

## 2016-03-24 DIAGNOSIS — J020 Streptococcal pharyngitis: Secondary | ICD-10-CM | POA: Insufficient documentation

## 2016-03-24 DIAGNOSIS — Z791 Long term (current) use of non-steroidal anti-inflammatories (NSAID): Secondary | ICD-10-CM | POA: Insufficient documentation

## 2016-03-24 LAB — POCT RAPID STREP A: STREPTOCOCCUS, GROUP A SCREEN (DIRECT): POSITIVE — AB

## 2016-03-24 MED ORDER — AMOXICILLIN 500 MG PO CAPS: 500.0000 mg | ORAL_CAPSULE | Freq: Three times a day (TID) | ORAL | Status: DC

## 2016-03-24 MED ORDER — LIDOCAINE VISCOUS 2 % MT SOLN: 5.0000 mL | Freq: Four times a day (QID) | OROMUCOSAL | Status: DC | PRN

## 2016-03-24 MED ORDER — PSEUDOEPH-BROMPHEN-DM 30-2-10 MG/5ML PO SYRP: 5.0000 mL | ORAL_SOLUTION | Freq: Four times a day (QID) | ORAL | Status: DC | PRN

## 2016-03-24 NOTE — Discharge Instructions (Signed)

## 2016-03-24 NOTE — ED Provider Notes (Signed)
[facility] Emergency Department Provider Note   ____________________________________________  Time seen: Approximately 1:05 PM  I have reviewed the triage vital signs and the nursing notes.   HISTORY  Chief Complaint Sore Throat    HPI Brandy Becker is a 25 y.o. female patient complaining of sore throat for 2 days. Patient denies any other URI signs and symptoms. Has also been no fever with this complaint. Patient states she been exposed to strep by family members. No palliative measures taken for this complaint. Patient rates pain as 8/10. Patient states she can tolerate fluids had difficulty with solid foods.   Past Medical History  Diagnosis Date  . Hx of herpes genitalis   . History of chlamydia   . GERD (gastroesophageal reflux disease)   . History of multiple miscarriages     x2  . Vaginal spotting     currently pregnant    Patient Active Problem List   Diagnosis Date Noted  . Vaginal spotting   . History of multiple miscarriages   . GERD (gastroesophageal reflux disease)   . Hx of herpes genitalis   . History of chlamydia     Past Surgical History  Procedure Laterality Date  . Cesarean section  2009  . Tonsillectomy    . Dilation and evacuation N/A 07/08/2015    Procedure: DILATATION AND EVACUATION;  Surgeon: [name], MD;  Location: [facility] ORS;  Service: Gynecology;  Laterality: N/A;    Current Outpatient Rx  Name  Route  Sig  Dispense  Refill  . amoxicillin (AMOXIL) 500 MG capsule   Oral   Take 1 capsule (500 mg total) by mouth 3 (three) times daily.   30 capsule   0   . azithromycin (ZITHROMAX) 500 MG tablet   Oral   Take 1 tablet (500 mg total) by mouth daily. Take 1 tablet daily for 3 days.   3 tablet   0   . brompheniramine-pseudoephedrine-DM 30-2-10 MG/5ML syrup   Oral   Take 5 mLs by mouth 4 (four) times daily as needed.   120 mL   0   . brompheniramine-pseudoephedrine-DM 30-2-10 MG/5ML syrup   Oral   Take 5 mLs by mouth 4 (four) times daily as needed. Mixed with 5 mL of viscous lidocaine for swish and swallow.   120 mL   0   . diazepam (VALIUM) 5 MG tablet   Oral   Take 1 tablet (5 mg total) by mouth 2 (two) times daily as needed for muscle spasms.   10 tablet   0   . etonogestrel (NEXPLANON) 68 MG IMPL implant   Subdermal   1 each (68 mg total) by Subdermal route once.   1 each   0   . ibuprofen (ADVIL,MOTRIN) 800 MG tablet   Oral   Take 1 tablet (800 mg total) by mouth 3 (three) times daily.   30 tablet   1   . ibuprofen (ADVIL,MOTRIN) 800 MG tablet   Oral   Take 1 tablet (800 mg total) by mouth every 8 (eight) hours as needed.   30 tablet   0   . lidocaine (XYLOCAINE) 2 % solution   Mouth/Throat   Use as directed 5 mLs in the mouth or throat every 6 (six) hours as needed for mouth pain. Mixed with 5 mL of Bromfed-DM for swish and swallow.   100 mL   0   . traMADol (ULTRAM) 50 MG tablet   Oral   Take  1 tablet (50 mg total) by mouth every 6 (six) hours as needed for moderate pain.   12 tablet   0     Allergies Oxycodone  Family History  Problem Relation Age of Onset  . Esophageal cancer Maternal Grandfather   . Lung cancer      fob dad  . Seizures Son     grew out of them  . Hypertension Mother     Social History Social History  Substance Use Topics  . Smoking status: Former Games developer  . Smokeless tobacco: Never Used  . Alcohol Use: No    Review of Systems Constitutional: No fever/chills Eyes: No visual changes. ENT: Sore throat Cardiovascular: Denies chest pain. Respiratory: Denies shortness of breath. Gastrointestinal: No abdominal pain.  No nausea, no vomiting.  No diarrhea.  No constipation. Genitourinary: Negative for dysuria. Musculoskeletal: Negative for back pain. Skin: Negative for rash. Neurological: Negative for headaches, focal weakness or numbness. Allergic/Immunilogical: Oxycodone  ____________________________________________   PHYSICAL EXAM:  VITAL SIGNS: ED Triage Vitals  Enc Vitals Group     BP 03/24/16 1241 125/69 mmHg     Pulse Rate 03/24/16 1241 96     Resp 03/24/16 1241 18     Temp 03/24/16 1241 98.4 F (36.9 C)     Temp Source 03/24/16 1241 Oral     SpO2 03/24/16 1241 99 %     Weight 03/24/16 1238 215 lb (97.523 kg)     Height 03/24/16 1238 5\' 6"  (1.676 m)     Head Cir --      Peak Flow --      Pain Score 03/24/16 1238 8     Pain Loc --      Pain Edu? --      Excl. in GC? --     Constitutional: Alert and oriented. Well appearing and in no acute distress. Eyes: Conjunctivae are normal. PERRL. EOMI. Head: Atraumatic. Nose: No congestion/rhinnorhea. Mouth/Throat: Mucous membranes are moist.  Oropharynx non-erythematous.Bilateral exudative tonsils Neck: No stridor.  No cervical spine tenderness to palpation. Hematological/Lymphatic/Immunilogical: No cervical lymphadenopathy. Cardiovascular: Normal rate, regular rhythm. Grossly normal heart sounds.  Good peripheral circulation. Respiratory: Normal respiratory effort.  No retractions. Lungs CTAB. Gastrointestinal: Soft and nontender. No distention. No abdominal bruits. No CVA tenderness. Musculoskeletal: No lower extremity tenderness nor edema.  No joint effusions. Neurologic:  Normal speech and language. No gross focal neurologic deficits are appreciated. No gait instability. Skin:  Skin is warm, dry and intact. No rash noted. Psychiatric: Mood and affect are normal. Speech and behavior are normal.  ____________________________________________   LABS (all labs ordered are listed, but only abnormal results are displayed)  Labs Reviewed - No data to display ____________________________________________  EKG   ____________________________________________  RADIOLOGY   ____________________________________________   PROCEDURES  Procedure(s) performed: None  Critical Care performed:  No  ____________________________________________   INITIAL IMPRESSION / ASSESSMENT AND PLAN / ED COURSE  Pertinent labs & imaging results that were available during my care of the patient were reviewed by me and considered in my medical decision making (see chart for details).  Patient rapid strep test positive. Patient given discharge care instructions. Patient given a prescription for amoxicillin, Bromfed DM, and viscous lidocaine. Patient given a work note. [facility]. ____________________________________________   FINAL CLINICAL IMPRESSION(S) / ED DIAGNOSES  Final diagnoses:  Strep pharyngitis      NEW MEDICATIONS STARTED DURING THIS VISIT:  New Prescriptions   AMOXICILLIN (AMOXIL) 500  MG CAPSULE    Take 1 capsule (500 mg total) by mouth 3 (three) times daily.   BROMPHENIRAMINE-PSEUDOEPHEDRINE-DM 30-2-10 MG/5ML SYRUP    Take 5 mLs by mouth 4 (four) times daily as needed. Mixed with 5 mL of viscous lidocaine for swish and swallow.   LIDOCAINE (XYLOCAINE) 2 % SOLUTION    Use as directed 5 mLs in the mouth or throat every 6 (six) hours as needed for mouth pain. Mixed with 5 mL of Bromfed-DM for swish and swallow.     Note:  This document was prepared using Dragon voice recognition software and may include unintentional dictation errors.    Joni ReiningRonald K Smith, PA-C 03/24/16 1331  Jene Everyobert Kinner, MD 03/24/16 [phone]

## 2016-03-24 NOTE — ED Notes (Signed)
States she developed a sore throat couple of days ago  No fever but has been exposed to strept throat by family member

## 2016-03-24 NOTE — ED Notes (Signed)
Pt c/o sore throat for the past 2 days 

## 2016-07-28 ENCOUNTER — Ambulatory Visit (INDEPENDENT_AMBULATORY_CARE_PROVIDER_SITE_OTHER): Payer: Self-pay | Admitting: Obstetrics and Gynecology

## 2016-07-28 ENCOUNTER — Encounter: Payer: Self-pay | Admitting: Obstetrics and Gynecology

## 2016-07-28 VITALS — BP 116/71 | HR 90 | Ht 66.0 in | Wt 244.8 lb

## 2016-07-28 DIAGNOSIS — Z3046 Encounter for surveillance of implantable subdermal contraceptive: Secondary | ICD-10-CM

## 2016-07-28 MED ORDER — NORETHIN ACE-ETH ESTRAD-FE 1.5-30 MG-MCG PO TABS: 1.0000 | ORAL_TABLET | Freq: Every day | ORAL | 11 refills | Status: DC

## 2016-07-28 NOTE — Progress Notes (Signed)
Elizebeth BrookingBecky Bunton is a 25 y.o. year old Hispanic female here for Implanon removal.  Patient given informed consent for removal of her Implanon.is having heavy and ireggular menses, weight gain and mood swings. Desires to switch back to OCP  BP 116/71   Pulse 90   Ht 5\' 6"  (1.676 m)   Wt 244 lb 12.8 oz (111 kg)   LMP 07/07/2016   BMI 39.51 kg/m   Appropriate time out taken. Implanon site identified.  Area prepped in usual sterile fashon. One cc of 2% lidocaine was used to anesthetize the area at the distal end of the implant. A small stab incision was made right beside the implant on the distal portion.  The Implanon rod was grasped using hemostats and removed without difficulty.  There was less than 3 cc blood loss. There were no complications.  Steri-strips were applied over the small incision and a pressure bandage was applied.  The patient tolerated the procedure well.  She was instructed to keep the area clean and dry, remove pressure bandage in 24 hours, and keep insertion site covered with the steri-strip for 3-5 days.    RX for Microgestin sent in- to start today.Follow-up PRN problems. Also needs physical will schedule for 2-3 months out.   Olive Zmuda N Jaydian Santana,CNM

## 2016-09-22 ENCOUNTER — Ambulatory Visit: Payer: Medicaid Other | Admitting: Obstetrics and Gynecology

## 2016-10-20 ENCOUNTER — Ambulatory Visit: Payer: Self-pay | Admitting: Obstetrics and Gynecology

## 2016-12-27 ENCOUNTER — Emergency Department: Payer: BLUE CROSS/BLUE SHIELD

## 2016-12-27 ENCOUNTER — Emergency Department
Admission: EM | Admit: 2016-12-27 | Discharge: 2016-12-27 | Disposition: A | Discharge status: Home / Self Care | Payer: BLUE CROSS/BLUE SHIELD | Attending: Emergency Medicine | Admitting: Emergency Medicine

## 2016-12-27 DIAGNOSIS — Z791 Long term (current) use of non-steroidal anti-inflammatories (NSAID): Secondary | ICD-10-CM | POA: Diagnosis not present

## 2016-12-27 DIAGNOSIS — Z79899 Other long term (current) drug therapy: Secondary | ICD-10-CM | POA: Insufficient documentation

## 2016-12-27 DIAGNOSIS — M549 Dorsalgia, unspecified: Secondary | ICD-10-CM | POA: Diagnosis not present

## 2016-12-27 DIAGNOSIS — R1013 Epigastric pain: Secondary | ICD-10-CM | POA: Insufficient documentation

## 2016-12-27 DIAGNOSIS — Z87891 Personal history of nicotine dependence: Secondary | ICD-10-CM | POA: Diagnosis not present

## 2016-12-27 DIAGNOSIS — R0789 Other chest pain: Secondary | ICD-10-CM | POA: Diagnosis present

## 2016-12-27 DIAGNOSIS — R079 Chest pain, unspecified: Secondary | ICD-10-CM | POA: Insufficient documentation

## 2016-12-27 LAB — CBC
HEMATOCRIT: 38.5 % (ref 35.0–47.0)
HEMOGLOBIN: 13.5 g/dL (ref 12.0–16.0)
MCH: 29.6 pg (ref 26.0–34.0)
MCHC: 35 g/dL (ref 32.0–36.0)
MCV: 84.6 fL (ref 80.0–100.0)
Platelets: 317 10*3/uL (ref 150–440)
RBC: 4.55 MIL/uL (ref 3.80–5.20)
RDW: 13 % (ref 11.5–14.5)
WBC: 10.1 10*3/uL (ref 3.6–11.0)

## 2016-12-27 LAB — BASIC METABOLIC PANEL
Anion gap: 9 (ref 5–15)
BUN: 13 mg/dL (ref 6–20)
CHLORIDE: 104 mmol/L (ref 101–111)
CO2: 21 mmol/L — ABNORMAL LOW (ref 22–32)
Calcium: 9 mg/dL (ref 8.9–10.3)
Creatinine, Ser: 0.93 mg/dL (ref 0.44–1.00)
GFR calc non Af Amer: >60 mL/min (ref >60)
Glucose, Bld: 115 mg/dL — ABNORMAL HIGH (ref 65–99)
Potassium: 4 mmol/L (ref 3.5–5.1)
SODIUM: 134 mmol/L — AB (ref 135–145)

## 2016-12-27 LAB — LIPASE, BLOOD: Lipase: 23 U/L (ref 11–51)

## 2016-12-27 LAB — TROPONIN I: Troponin I: <0.03 ng/mL (ref <0.03)

## 2016-12-27 MED ORDER — OMEPRAZOLE 40 MG PO CPDR: 40.0000 mg | DELAYED_RELEASE_CAPSULE | Freq: Every day | ORAL | 0 refills | Status: DC

## 2016-12-27 MED ORDER — ONDANSETRON 4 MG PO TBDP: 4.0000 mg | ORAL_TABLET | Freq: Three times a day (TID) | ORAL | 0 refills | Status: DC | PRN

## 2016-12-27 MED ORDER — GI COCKTAIL ~~LOC~~
30.0000 mL | Freq: Once | ORAL | Status: AC
  Administered 2016-12-27: 30 mL via ORAL
  Filled 2016-12-27: qty 30

## 2016-12-27 NOTE — ED Triage Notes (Addendum)
Pt reports being woken up from sleep with pain to L side of chest that radiates to her R back/shoulder area.  Pt also reports having some epigastric pain at this time.  Pt tearful in triage.  Pt reports shortness of breath, nausea, and dizziness with this pain.  Pt breathing even, but labored.  Pt tearful on and off.  Pt states she ate heavy meal before bed and that she has a history of acid reflux.

## 2016-12-27 NOTE — ED Provider Notes (Addendum)
[facility] Emergency Department Provider Note  ____________________________________________  Time seen: Approximately 8:04 AM  I have reviewed the triage vital signs and the nursing notes.   HISTORY  Chief Complaint Chest Pain and Back Pain    HPI Brandy Becker is a 26 y.o. female with a history of GERD, morbid obesity, presenting with chest pain. The patient reports she had a fairly large meal last night and at 2 AM awoke with a left-sided chest "pressure, sharp pain, and burning sensation" which radiated to the back and up into the throat. This was associated with epigastric pain, and a sensation of having to take small breaths, not shortness of breath. She felt cold chills and hot at the same time. She took a Prilosec without any improvement. She does not take any reflux medicine patients on a regular basis. The patient reports that over the last several months she has had an increase in her GERD symptoms, now experiencing "indigestion" several times a week.  At this time, the patient's symptoms have almost completely resolved.    Past Medical History:  Diagnosis Date  . GERD (gastroesophageal reflux disease)   . History of chlamydia   . History of multiple miscarriages    x2  . Hx of herpes genitalis   . Vaginal spotting    currently pregnant    Patient Active Problem List   Diagnosis Date Noted  . Vaginal spotting   . History of multiple miscarriages   . GERD (gastroesophageal reflux disease)   . Hx of herpes genitalis   . History of chlamydia     Past Surgical History:  Procedure Laterality Date  . CESAREAN SECTION  2009  . DILATION AND EVACUATION N/A 07/08/2015   Procedure: DILATATION AND EVACUATION;  Surgeon: Herold HarmsMartin A Defrancesco, MD;  Location: [facility] ORS;  Service: Gynecology;  Laterality: N/A;  . TONSILLECTOMY      Current Outpatient Rx  . Order #: [id number] Class: Print  . Order #: [id number] Class: Print  . Order #: [id number] Class:  Print  . Order #: [id number] Class: Print  . Order #: [id number] Class: Normal  . Order #: [id number] Class: Print  . Order #: [id number] Class: Print  . Order #: [id number] Class: Normal  . Order #: [id number] Class: Print  . Order #: [id number] Class: Print  . Order #: [id number] Class: Print    Allergies Oxycodone  Family History  Problem Relation Age of Onset  . Hypertension Mother   . Esophageal cancer Maternal Grandfather   . Lung cancer      fob dad  . Seizures Son     grew out of them    Social History Social History  Substance Use Topics  . Smoking status: Former Games developermoker  . Smokeless tobacco: Never Used  . Alcohol use No    Review of Systems Constitutional: No lightheadedness or syncope. Positive cold chills and hot sensations simultaneously. No diaphoresis.  Eyes: No visual changes. no eye discharge.  ENT: No sore throat. No congestion or rhinorrhea. CardiovascularPositivest pain. Denies palpitations. Respiratory: Denies shortness of breath.  No cough. Gastrointestinal:  positive epigastrical pain.   Positive nausea, no vomiting.  No diarrhea.  No constipation. Genitourinary: Negative for dysuria. Musculoskeletal: Negative for back pain. Skin: Negative for rash. Neurological: Negative for headaches. No focal numbness, tingling or weakness.   10-point ROS otherwise negative.  ____________________________________________   PHYSICAL EXAM:  VITAL SIGNS: ED Triage Vitals  Enc Vitals Group     BP 12/27/16 0636 123/89     Pulse Rate 12/27/16 0738  71     Resp 12/27/16 0636 (!) 22     Temp 12/27/16 0637 98.2 F (36.8 C)     Temp Source 12/27/16 0637 Oral     SpO2 12/27/16 0738 98 %     Weight 12/27/16 0637 235 lb (106.6 kg)     Height 12/27/16 0637 5\' 6"  (1.676 m)     Head Circumference --      Peak Flow --      Pain Score 12/27/16 0638 10     Pain Loc --      Pain Edu? --      Excl. in GC? --     Constitutional: Alert and oriented. Well appearing and in no  acute distress. Answers questions appropriately. Eyes: Conjunctivae are normal.  EOMI. No scleral icterus. Head: Atraumatic. Nose: No congestion/rhinnorhea. Mouth/Throat: Mucous membranes are moist.  Neck: No stridor.  Supple.  No JVD.  Cardiovascular: Normal rate, regular rhythm. No murmurs, rubs or gallops.  Respiratory: Normal respiratory effort.  No accessory muscle use or retractions. Lungs CTAB.  No wheezes, rales or ronchi. Gastrointestinal:  obese. Soft, and nondistended.   minimal tenderness to palpation in the epigastrium. No Murphy sign.No guarding or rebound.  No peritoneal signs. Musculoskeletal: No LE edema. No ttp in the calves or palpable cords.  Negative Homan's sign. Neurologic:  A&Ox3.  Speech is clear.  Face and smile are symmetric.  EOMI.  Moves all extremities well. Skin:  Skin is warm, dry and intact. No rash noted. Psychiatric: Mood and affect are normal. Speech and behavior are normal.  Normal judgement.  ____________________________________________   LABS (all labs ordered are listed, but only abnormal results are displayed)  Labs Reviewed  BASIC METABOLIC PANEL - Abnormal; Notable for the following:       Result Value   Sodium 134 (*)    CO2 21 (*)    Glucose, Bld 115 (*)    All other components within normal limits  CBC  TROPONIN I  LIPASE, BLOOD   ____________________________________________  EKG  ED ECG REPORT I, Rockne Menghini, the attending physician, personally viewed and interpreted this ECG.   Date: 12/27/2016  EKG Time: 631  Rate: 85  Rhythm: normal sinus rhythm  Axis: normal  Intervals:none  ST&T Change: Nonspecific T-wave inversion in V1. No STEMI.  ____________________________________________  RADIOLOGY  Dg Chest 2 View  Result Date: 12/27/2016 CLINICAL DATA:  Left-sided chest pain. EXAM: CHEST  2 VIEW COMPARISON:  09/14/2015 FINDINGS: The heart size and mediastinal contours are within normal limits. Both lungs are  clear. The visualized skeletal structures are unremarkable. Negative for a pneumothorax. IMPRESSION: No active cardiopulmonary disease. Electronically Signed   By: Richarda Overlie M.D.   On: 12/27/2016 07:35    ____________________________________________   PROCEDURES  Procedure(s) performed: None  Procedures  Critical Care performed: No ____________________________________________   INITIAL IMPRESSION / ASSESSMENT AND PLAN / ED COURSE  Pertinent labs & imaging results that were available during my care of the patient were reviewed by me and considered in my medical decision making (see chart for details).  26 y.o. female with a history of GERD not on any daily prophylaxis, presenting with chest pain and back pain, epigastric discomfort after a large meal. Overall, the patient has reassuring vital signs and an physical examination which is consistent with some mild epigastric discomfort. She does not have any acute cardiopulmonary abnormalities on my examination, and her chest x-ray does not show any acute disease process. Her laboratory  studies are reassuring, including a negative troponin. She is very young with few risk factors for cardiac disease and her symptoms are not consistent with ACS or MI. Her EKG does not show ischemic changes and her troponin is negative. I'll plan to treat her with a GI cocktail here, and initiate a daily PPI, as well as give the patient recommended dietary changes to prevent symptoms. We'll have her follow-up with her primary care physician, which she has already been establishing at [facility] primary care in [place]. She understands return precautions as well follow-up instructions.   ____________________________________________  FINAL CLINICAL IMPRESSION(S) / ED DIAGNOSES  Final diagnoses:  Chest pain, unspecified type  Epigastric pain  Acute right-sided back pain, unspecified back location         NEW MEDICATIONS STARTED DURING THIS VISIT:  New  Prescriptions   OMEPRAZOLE (PRILOSEC) 40 MG CAPSULE    Take 1 capsule (40 mg total) by mouth daily.   ONDANSETRON (ZOFRAN ODT) 4 MG DISINTEGRATING TABLET    Take 1 tablet (4 mg total) by mouth every 8 (eight) hours as needed for nausea or vomiting.      Rockne Menghini, MD 12/27/16 1610    Rockne Menghini, MD 12/27/16 [phone]

## 2016-12-27 NOTE — ED Notes (Signed)
waiting on lipase results for discharge per dr Sharma Covertnorman

## 2016-12-27 NOTE — Discharge Instructions (Signed)
Please follow the instructions for the GERD diet. Please eat small regular meals throughout the day and avoid large meals. Avoid spicy foods, fried foods.  Take omeprazole daily to prevent reflux.  Please establish a primary care physician and follow-up for your symptoms.  Return to the emergency department if you develop chest pain, shortness of breath, lightheadedness or fainting, or any other symptoms concerning to you.

## 2017-03-13 LAB — HM PAP SMEAR: HM Pap smear: NEGATIVE

## 2017-09-12 IMAGING — CR DG CHEST 2V
2 series · 2 of 2 positions shown · non-contrast
Comparison: 09/14/2015

CLINICAL DATA: Left-sided chest pain.

EXAM:
CHEST  2 VIEW

[chest pa]
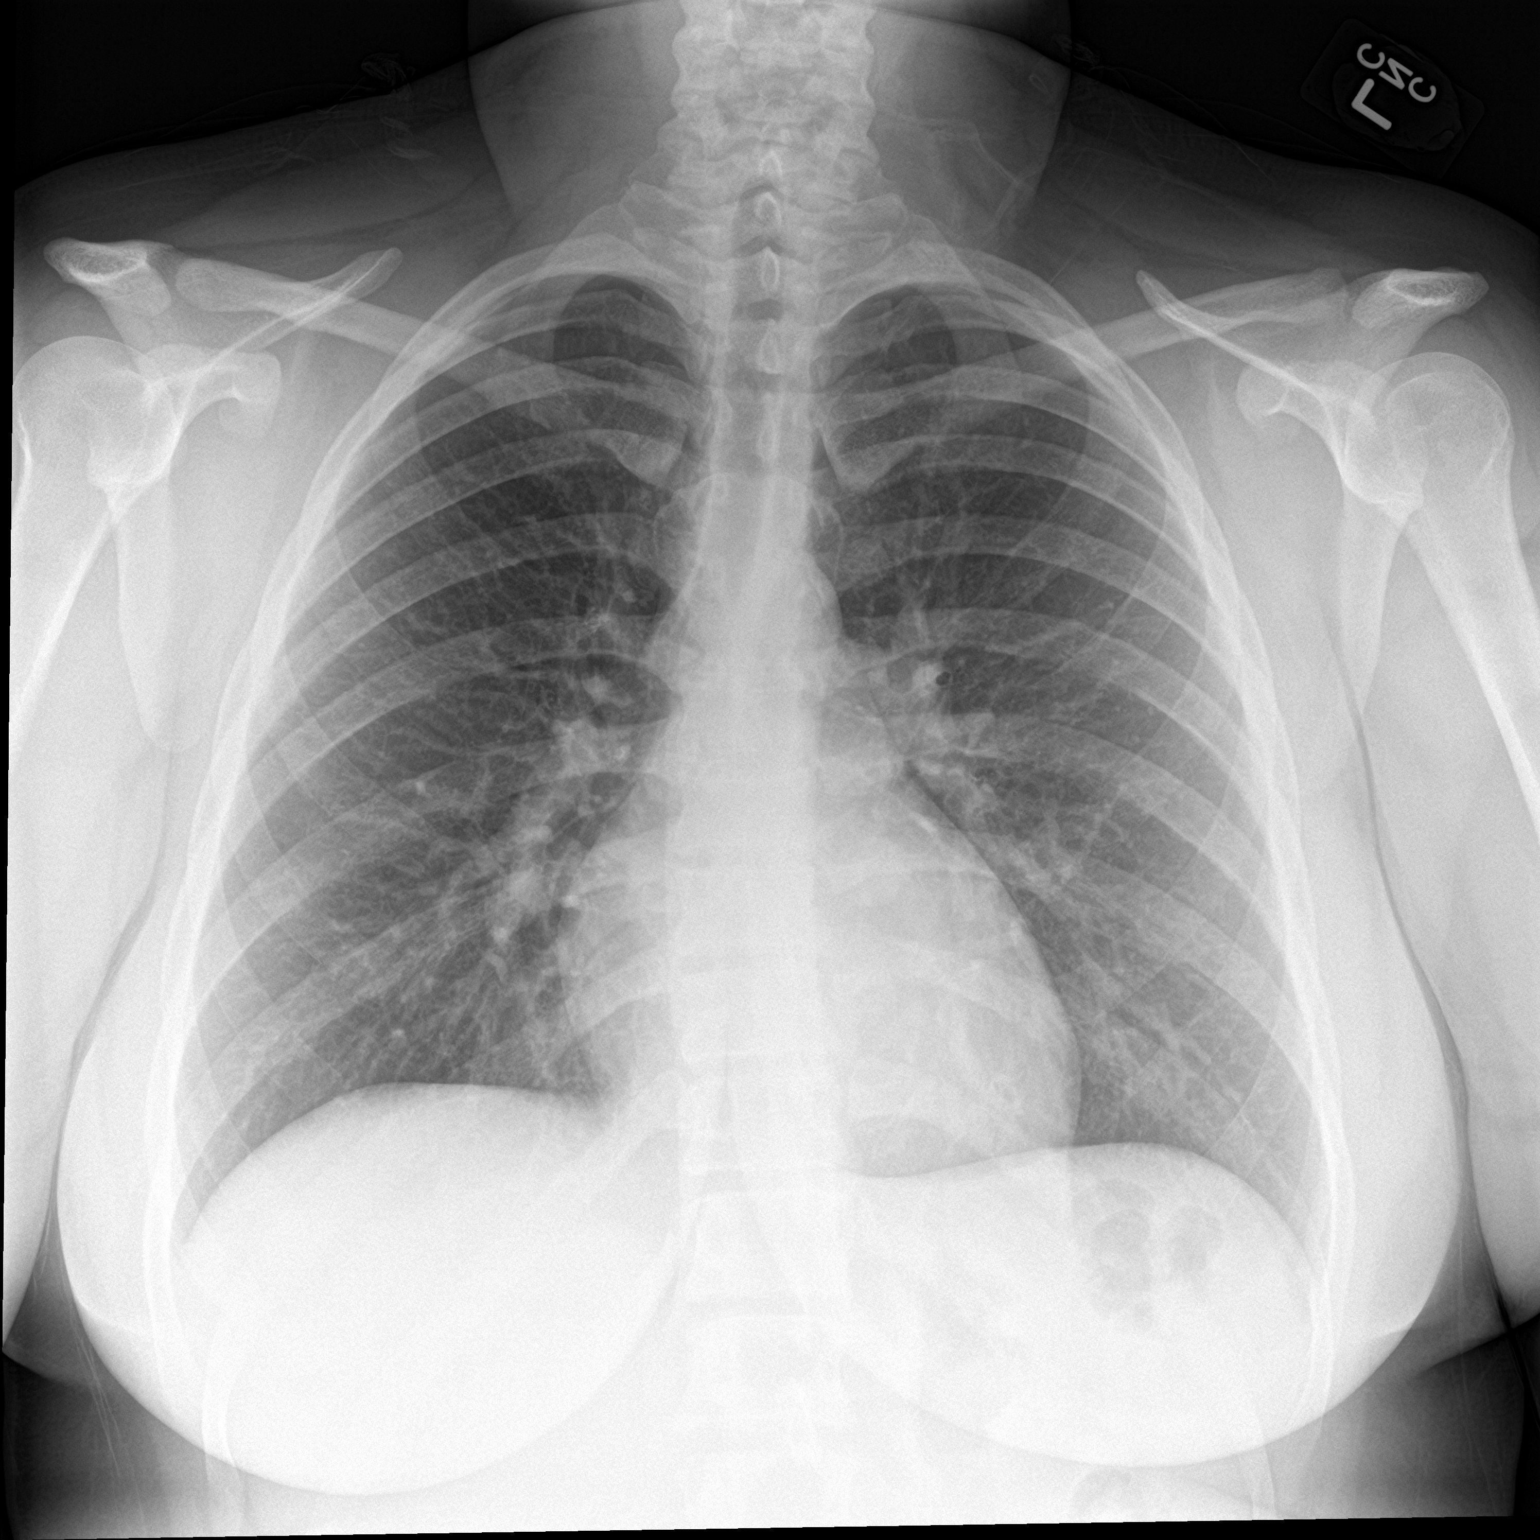

[chest lat]
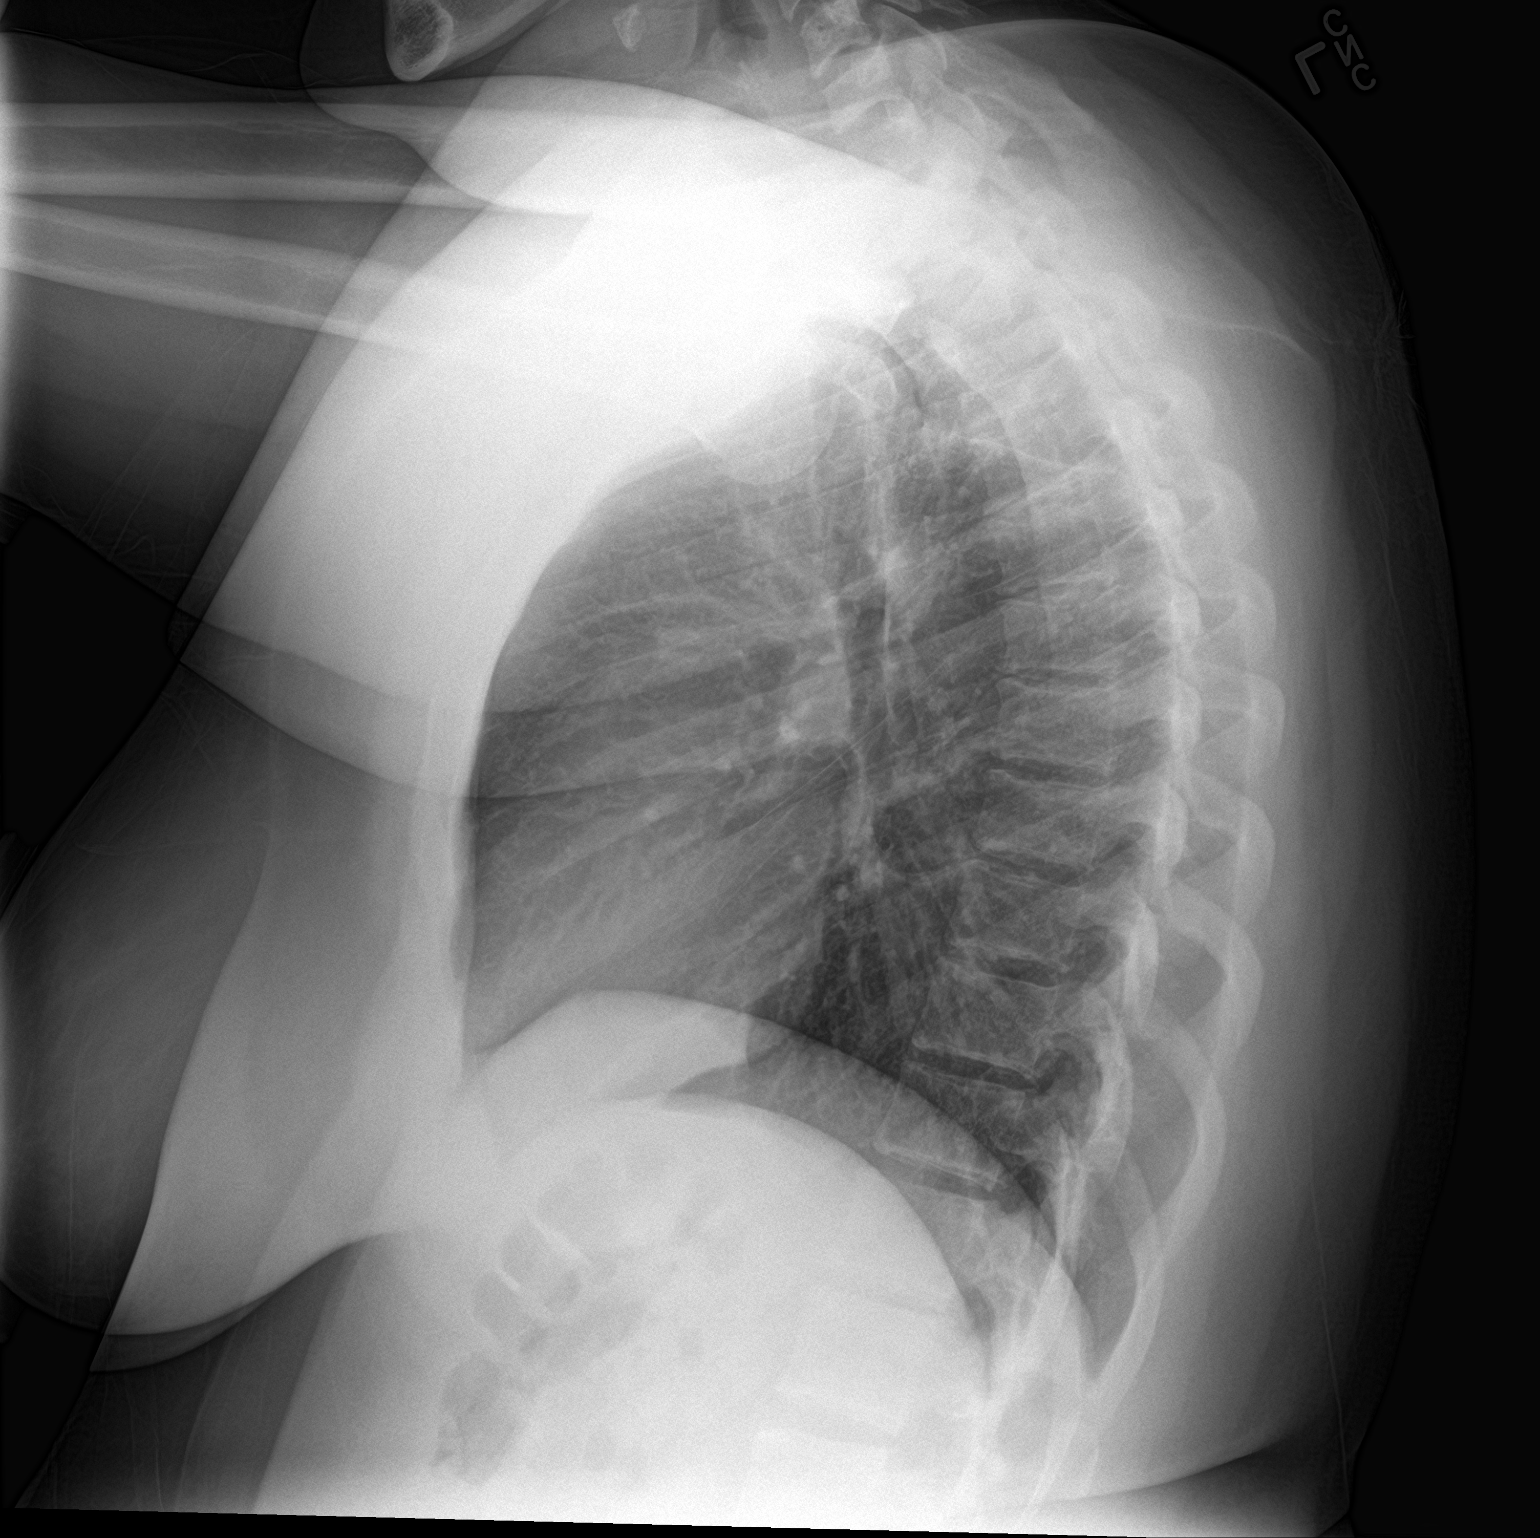

[2 of 2 positions shown; findings below may reference images not displayed]

FINDINGS: The heart size and mediastinal contours are within normal limits.
Both lungs are clear. The visualized skeletal structures are
unremarkable. Negative for a pneumothorax.
IMPRESSION: No active cardiopulmonary disease.

## 2018-03-07 ENCOUNTER — Encounter: Payer: Self-pay | Admitting: Physician Assistant

## 2018-03-07 ENCOUNTER — Ambulatory Visit (INDEPENDENT_AMBULATORY_CARE_PROVIDER_SITE_OTHER): Payer: BLUE CROSS/BLUE SHIELD | Admitting: Physician Assistant

## 2018-03-07 VITALS — BP 128/82 | HR 82 | Temp 99.0°F | Ht 66.0 in | Wt 240.4 lb

## 2018-03-07 DIAGNOSIS — F32A Depression, unspecified: Secondary | ICD-10-CM

## 2018-03-07 DIAGNOSIS — F329 Major depressive disorder, single episode, unspecified: Secondary | ICD-10-CM

## 2018-03-07 DIAGNOSIS — F419 Anxiety disorder, unspecified: Secondary | ICD-10-CM

## 2018-03-07 DIAGNOSIS — Z1329 Encounter for screening for other suspected endocrine disorder: Secondary | ICD-10-CM

## 2018-03-07 DIAGNOSIS — Z13220 Encounter for screening for lipoid disorders: Secondary | ICD-10-CM

## 2018-03-07 DIAGNOSIS — Z9229 Personal history of other drug therapy: Secondary | ICD-10-CM | POA: Diagnosis not present

## 2018-03-07 DIAGNOSIS — Z131 Encounter for screening for diabetes mellitus: Secondary | ICD-10-CM | POA: Diagnosis not present

## 2018-03-07 DIAGNOSIS — R350 Frequency of micturition: Secondary | ICD-10-CM | POA: Diagnosis not present

## 2018-03-07 DIAGNOSIS — Z130 Encounter for screening for diseases of the blood and blood-forming organs and certain disorders involving the immune mechanism: Secondary | ICD-10-CM

## 2018-03-07 LAB — POCT URINALYSIS DIPSTICK
Bilirubin, UA: NEGATIVE
Blood, UA: NEGATIVE
Glucose, UA: NEGATIVE
Ketones, UA: NEGATIVE
Leukocytes, UA: NEGATIVE
Nitrite, UA: NEGATIVE
Protein, UA: NEGATIVE
Spec Grav, UA: <=1.005 — AB (ref 1.010–1.025)
Urobilinogen, UA: 0.2 E.U./dL
pH, UA: 7.5 (ref 5.0–8.0)

## 2018-03-07 MED ORDER — ESCITALOPRAM OXALATE 10 MG PO TABS: 10.0000 mg | ORAL_TABLET | Freq: Every day | ORAL | 0 refills | Status: DC

## 2018-03-07 NOTE — Patient Instructions (Signed)
Depression Screening Depression screening is a tool that your health care provider can use to learn if you have symptoms of depression. Depression is a common condition with many symptoms that are also often found in other conditions. Depression is treatable, but it must first be diagnosed. You may not know that certain feelings, thoughts, and behaviors that you are having can be symptoms of depression. Taking a depression screening test can help you and your health care provider decide if you need more assessment, or if you should be referred to a mental health care provider. What are the screening tests?  You may have a physical exam to see if another condition is affecting your mental health. You may have a blood or urine sample taken during the physical exam.  You may be interviewed using a screening tool that was developed from research, such as one of these: ? Patient Health Questionnaire (PHQ). This is a set of either 2 or 9 questions. A health care provider who has been trained to score this screening test uses a guide to assess if your symptoms suggest that you may have depression. ? Hamilton Depression Rating Scale (HAM-D). This is a set of either 17 or 24 questions. You may be asked to take it again during or after your treatment, to see if your depression has gotten better. ? Beck Depression Inventory (BDI). This is a set of 21 multiple choice questions. Your health care provider scores your answers to assess:  Your level of depression, ranging from mild to severe.  Your response to treatment.  Your health care provider may talk with you about your daily activities, such as eating, sleeping, work, and recreation, and ask if you have had any changes in activity.  Your health care provider may ask you to see a mental health specialist, such as a psychiatrist or psychologist, for more evaluation. Who should be screened for depression?  All adults, including adults with a family history  of a mental health disorder.  Adolescents who are 12-18 years old.  People who are recovering from a myocardial infarction (MI).  Pregnant women, or women who have given birth.  People who have a long-term (chronic) illness.  Anyone who has been diagnosed with another type of a mental health disorder.  Anyone who has symptoms that could show depression. What do my results mean? Your health care provider will review the results of your depression screening, physical exam, and lab tests. Positive screens suggest that you may have depression. Screening is the first step in getting the care that you may need. It is up to you to get your screening results. Ask your health care provider, or the department that is doing your screening tests, when your results will be ready. Talk with your health care provider about your results and diagnosis. A diagnosis of depression is made using the Diagnostic and Statistical Manual of Mental Disorders (DSM-V). This is a book that lists the number and type of symptoms that must be present for a health care provider to give a specific diagnosis.  Your health care provider may work with you to treat your symptoms of depression, or your health care provider may help you find a mental health provider who can assess, diagnose, and treat your depression. Get help right away if:  You have thoughts about hurting yourself or others. If you ever feel like you may hurt yourself or others, or have thoughts about taking your own life, get help right away. You can   go to your nearest emergency department or call:  Your local emergency services (911 in the [place]  A suicide crisis helpline, such as the National Suicide Prevention Lifeline at 1-800-273-8255. This is open 24 hours a day.  Summary  Depression screening is the first step in getting the help that you may need.  If your screening test shows symptoms of depression (is positive), your health care provider may ask  you to see a mental health provider.  Anyone who is age 12 or older should be screened for depression. This information is not intended to replace advice given to you by your health care provider. Make sure you discuss any questions you have with your health care provider. Document Released: 03/10/2017 Document Revised: 03/10/2017 Document Reviewed: 03/10/2017 Elsevier Interactive Patient Education  2018 Elsevier Inc.  

## 2018-03-07 NOTE — Progress Notes (Signed)
Patient: Brandy Becker Female    DOB: 11-28-1990   26 y.o.   MRN: [id number] Visit Date: 03/08/2018  Today's Provider: Trinna Post, PA-C   Chief Complaint  Patient presents with  . Establish Care   Subjective:    HPI   Brandy Becker is a 27 year old female who presents today to [facility] as a new patient. Not seeing anybody. She has a 31 year old son and is living in [place], living with her son and mother. Working as Nurse, children's.   She does not recall a previous tetanus shot in the past ten years. She says she had a PAP smear last year from the health department.   She says she sees a Social worker and has been for the past three years. She reports her mood can sometimes be ok, sometimes not. She relates this to her hormones, which are fine one month and not fine the next. She reports difficulty with motivation. Denies feelings of guilt, shame, SI/HI. She reports at one point she was referred to psychiatrist but for some reason this didn't happen. She is interested in medication today.  She is not on birth control and does not desire birth control. She says she got her nexplanon removed to help with her hormonal imbalance.   Says she feels congested in her head for months like she has a sinus infection. Taking flonase and zyrtec or claritin.   She is reporting urinary frequency and wants to have urine checked.  Traveling outside of country to [place], only has one documented MMR. Unsure if she got second dose, would like to know immunity.    Allergies  Allergen Reactions  . Oxycodone Other (See Comments)    Altered mental status     Current Outpatient Medications:  .  escitalopram (LEXAPRO) 10 MG tablet, Take 1 tablet (10 mg total) by mouth daily., Disp: 90 tablet, Rfl: 0  Review of Systems  Constitutional: Positive for activity change, appetite change, chills, diaphoresis and fatigue.       Crying and irritability   HENT: Positive for  congestion, ear discharge, ear pain, mouth sores, rhinorrhea, sinus pressure, sneezing, sore throat and tinnitus.   Respiratory: Positive for chest tightness and shortness of breath.   Gastrointestinal: Positive for abdominal distention, abdominal pain, constipation, diarrhea and rectal pain.  Endocrine: Positive for heat intolerance, polydipsia, polyphagia and polyuria.  Genitourinary: Positive for frequency and vaginal discharge.  Musculoskeletal: Positive for arthralgias, back pain, neck pain and neck stiffness.  Allergic/Immunologic: Positive for environmental allergies.  Neurological: Positive for dizziness, light-headedness and headaches.  Psychiatric/Behavioral: Positive for confusion, decreased concentration and dysphoric mood. The patient is nervous/anxious.     Social History   Tobacco Use  . Smoking status: Former Research scientist (life sciences)  . Smokeless tobacco: Never Used  Substance Use Topics  . Alcohol use: Yes    Alcohol/week: 0.0 oz   Objective:   BP 128/82 (BP Location: Right Arm, Patient Position: Sitting, Cuff Size: Normal)   Pulse 82   Temp 99 F (37.2 C) (Oral)   Ht '5\' 6"'  (1.676 m)   Wt 240 lb 6.4 oz (109 kg)   LMP 02/28/2018   SpO2 98%   BMI 38.80 kg/m    Physical Exam  Constitutional: She is oriented to person, place, and time. She appears well-developed and well-nourished.  HENT:  Head: Normocephalic and atraumatic.  Right Ear: Tympanic membrane and external ear normal.  Left Ear: Tympanic membrane and  external ear normal.  Mouth/Throat: Oropharynx is clear and moist. No oropharyngeal exudate.  Eyes: Conjunctivae are normal. Right eye exhibits no discharge. Left eye exhibits no discharge.  Neck: Neck supple. No thyromegaly present.  Cardiovascular: Normal rate and regular rhythm.  Pulmonary/Chest: Effort normal and breath sounds normal.  Abdominal: Soft. Bowel sounds are normal. She exhibits no distension.  Lymphadenopathy:    She has no cervical adenopathy.    Neurological: She is alert and oriented to person, place, and time.  Skin: Skin is warm and dry.  Psychiatric: She has a normal mood and affect. Her behavior is normal.        Assessment & Plan:     1. Anxiety and depression  Start lexapro as below, counseled on side effects and duration of action.  - escitalopram (LEXAPRO) 10 MG tablet; Take 1 tablet (10 mg total) by mouth daily.  Dispense: 90 tablet; Refill: 0  2. Urinary frequency  Normal urine dipstick.   - POCT urinalysis dipstick  3. Diabetes mellitus screening  - Comprehensive Metabolic Panel (CMET)  4. Screening cholesterol level   5. Thyroid disorder screening  - TSH  6. Screening for deficiency anemia  - CBC with Differential  7. History of MMR vaccination  - Measles/Mumps/Rubella Immunity  Return in about 1 month (around 04/07/2018) for Depression and anxiety .  The entirety of the information documented in the History of Present Illness, Review of Systems and Physical Exam were personally obtained by me. Portions of this information were initially documented by Tiburcio Pea, CMA and reviewed by me for thoroughness and accuracy.           Trinna Post, PA-C  [facility]

## 2018-03-08 ENCOUNTER — Telehealth: Payer: Self-pay

## 2018-03-08 LAB — COMPREHENSIVE METABOLIC PANEL
ALT: 19 IU/L (ref 0–32)
AST: 18 IU/L (ref 0–40)
Albumin/Globulin Ratio: 2 (ref 1.2–2.2)
Albumin: 4.6 g/dL (ref 3.5–5.5)
Alkaline Phosphatase: 76 IU/L (ref 39–117)
BUN/Creatinine Ratio: 17 (ref 9–23)
BUN: 13 mg/dL (ref 6–20)
Bilirubin Total: <0.2 mg/dL (ref 0.0–1.2)
CO2: 22 mmol/L (ref 20–29)
Calcium: 9.2 mg/dL (ref 8.7–10.2)
Chloride: 102 mmol/L (ref 96–106)
Creatinine, Ser: 0.78 mg/dL (ref 0.57–1.00)
GFR calc Af Amer: 120 mL/min/{1.73_m2} (ref >59)
GFR calc non Af Amer: 104 mL/min/{1.73_m2} (ref >59)
Globulin, Total: 2.3 g/dL (ref 1.5–4.5)
Glucose: 85 mg/dL (ref 65–99)
Potassium: 4 mmol/L (ref 3.5–5.2)
Sodium: 138 mmol/L (ref 134–144)
Total Protein: 6.9 g/dL (ref 6.0–8.5)

## 2018-03-08 LAB — MEASLES/MUMPS/RUBELLA IMMUNITY
MUMPS ABS, IGG: 37.3 AU/mL (ref >10.9)
RUBEOLA AB, IGG: >300 AU/mL (ref >29.9)
Rubella Antibodies, IGG: 7.93 index (ref >0.99)

## 2018-03-08 LAB — CBC WITH DIFFERENTIAL/PLATELET
Basophils Absolute: 0.1 10*3/uL (ref 0.0–0.2)
Basos: 1 %
EOS (ABSOLUTE): 0.1 10*3/uL (ref 0.0–0.4)
Eos: 1 %
Hematocrit: 37.8 % (ref 34.0–46.6)
Hemoglobin: 12.6 g/dL (ref 11.1–15.9)
Immature Grans (Abs): 0.1 10*3/uL (ref 0.0–0.1)
Immature Granulocytes: 1 %
Lymphocytes Absolute: 3.8 10*3/uL — ABNORMAL HIGH (ref 0.7–3.1)
Lymphs: 39 %
MCH: 28.3 pg (ref 26.6–33.0)
MCHC: 33.3 g/dL (ref 31.5–35.7)
MCV: 85 fL (ref 79–97)
Monocytes Absolute: 0.6 10*3/uL (ref 0.1–0.9)
Monocytes: 6 %
Neutrophils Absolute: 5.3 10*3/uL (ref 1.4–7.0)
Neutrophils: 52 %
Platelets: 343 10*3/uL (ref 150–379)
RBC: 4.46 x10E6/uL (ref 3.77–5.28)
RDW: 13.3 % (ref 12.3–15.4)
WBC: 9.9 10*3/uL (ref 3.4–10.8)

## 2018-03-08 LAB — TSH: TSH: 3.29 u[IU]/mL (ref 0.450–4.500)

## 2018-03-08 NOTE — Telephone Encounter (Signed)
Advised patient of results.  

## 2018-03-08 NOTE — Telephone Encounter (Signed)
Left message to call back  

## 2018-03-08 NOTE — Telephone Encounter (Signed)
-----   Message from [name], [place] sent at 03/08/2018  9:27 AM EDT ----- Brandy Becker is normal and she shows immunity to measles, mumps, and rubella.

## 2018-03-12 ENCOUNTER — Encounter: Payer: Self-pay | Admitting: Physician Assistant

## 2018-04-10 ENCOUNTER — Ambulatory Visit: Payer: BLUE CROSS/BLUE SHIELD | Admitting: Physician Assistant

## 2018-04-10 ENCOUNTER — Encounter: Payer: Self-pay | Admitting: Physician Assistant

## 2018-04-10 ENCOUNTER — Ambulatory Visit (INDEPENDENT_AMBULATORY_CARE_PROVIDER_SITE_OTHER): Payer: BLUE CROSS/BLUE SHIELD | Admitting: Physician Assistant

## 2018-04-10 VITALS — BP 112/74 | HR 84 | Temp 98.2°F | Resp 16 | Wt 250.0 lb

## 2018-04-10 DIAGNOSIS — F32A Depression, unspecified: Secondary | ICD-10-CM | POA: Insufficient documentation

## 2018-04-10 DIAGNOSIS — F329 Major depressive disorder, single episode, unspecified: Secondary | ICD-10-CM

## 2018-04-10 DIAGNOSIS — F419 Anxiety disorder, unspecified: Secondary | ICD-10-CM | POA: Diagnosis not present

## 2018-04-10 MED ORDER — ESCITALOPRAM OXALATE 20 MG PO TABS: 20.0000 mg | ORAL_TABLET | Freq: Every day | ORAL | 0 refills | Status: DC

## 2018-04-10 NOTE — Patient Instructions (Signed)

## 2018-04-10 NOTE — Progress Notes (Signed)
Patient: Brandy Becker Female    DOB: May 30, 1991   27 y.o.   MRN: [id number] Visit Date: 04/10/2018  Today's Provider: [name], PA-C   Chief Complaint  Patient presents with  . Anxiety  . Depression   Subjective:   Reports improvement in things like [place], where she notices she is less nervous. Also reports she is better able to function in crowds and less irritable. Please with effect, interested in increasing dosage.  Depression         This is a chronic problem.  The problem has been gradually improving (Pt reports 70% improvment with her symptoms. ) since onset.  Associated symptoms include fatigue and decreased interest.  Associated symptoms include no decreased concentration, no helplessness, no hopelessness, does not have insomnia, no restlessness, no appetite change, no body aches, not sad and no suicidal ideas.  Past treatments include SSRIs - Selective serotonin reuptake inhibitors.  Compliance with treatment is good.  Previous treatment provided moderate relief.  Past medical history includes anxiety.   Anxiety  Presents for follow-up visit. Symptoms include depressed mood. Patient reports no confusion, decreased concentration, excessive worry, insomnia, nausea, nervous/anxious behavior, panic, restlessness or suicidal ideas. The quality of sleep is good. Nighttime awakenings: none.         Allergies  Allergen Reactions  . Oxycodone Other (See Comments)    Altered mental status     Current Outpatient Medications:  .  escitalopram (LEXAPRO) 10 MG tablet, Take 1 tablet (10 mg total) by mouth daily., Disp: 90 tablet, Rfl: 0  Review of Systems  Constitutional: Positive for fatigue. Negative for activity change, appetite change, chills, diaphoresis, fever and unexpected weight change.  Gastrointestinal: Positive for constipation. Negative for abdominal distention, abdominal pain, anal bleeding, blood in stool, diarrhea, nausea, rectal  pain and vomiting.  Psychiatric/Behavioral: Positive for depression. Negative for agitation, behavioral problems, confusion, decreased concentration, dysphoric mood, hallucinations, self-injury, sleep disturbance and suicidal ideas. The patient is not nervous/anxious, does not have insomnia and is not hyperactive.     Social History   Tobacco Use  . Smoking status: Former Games developer  . Smokeless tobacco: Never Used  Substance Use Topics  . Alcohol use: Yes    Alcohol/week: 0.0 oz   Objective:   BP 112/74 (BP Location: Right Arm, Patient Position: Sitting, Cuff Size: Large)   Pulse 84   Temp 98.2 F (36.8 C) (Oral)   Resp 16   Wt 250 lb (113.4 kg)   BMI 40.35 kg/m  Vitals:   04/10/18 1452  BP: 112/74  Pulse: 84  Resp: 16  Temp: 98.2 F (36.8 C)  TempSrc: Oral  Weight: 250 lb (113.4 kg)     Physical Exam  Constitutional: She is oriented to person, place, and time. She appears well-developed and well-nourished.  Cardiovascular: Normal rate and regular rhythm.  Pulmonary/Chest: Effort normal and breath sounds normal.  Neurological: She is alert and oriented to person, place, and time.  Skin: Skin is warm and dry.  Psychiatric: She has a normal mood and affect. Her behavior is normal.        Assessment & Plan:     1. Anxiety and depression  Good effect, increase to 20 mg and see back in roughly one month. Patient in [place] in one month. If stable on that, f/u every 6 mo.  - escitalopram (LEXAPRO) 20 MG tablet; Take 1 tablet (20 mg total) by mouth daily.  Dispense:  90 tablet; Refill: 0  No follow-ups on file.  The entirety of the information documented in the History of Present Illness, Review of Systems and Physical Exam were personally obtained by me. Portions of this information were initially documented by Kavin LeechLaura Walsh, CMA and reviewed by me for thoroughness and accuracy.        Trey SailorsAdriana M Oney Folz, PA-C  [facility]

## 2018-05-17 ENCOUNTER — Other Ambulatory Visit: Payer: Self-pay | Admitting: Physician Assistant

## 2018-05-17 DIAGNOSIS — F329 Major depressive disorder, single episode, unspecified: Secondary | ICD-10-CM

## 2018-05-17 DIAGNOSIS — F32A Depression, unspecified: Secondary | ICD-10-CM

## 2018-05-17 DIAGNOSIS — F419 Anxiety disorder, unspecified: Principal | ICD-10-CM

## 2018-05-17 MED ORDER — ESCITALOPRAM OXALATE 20 MG PO TABS: 20.0000 mg | ORAL_TABLET | Freq: Every day | ORAL | 0 refills | Status: DC
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
## 2018-05-22 ENCOUNTER — Ambulatory Visit (INDEPENDENT_AMBULATORY_CARE_PROVIDER_SITE_OTHER): Payer: BLUE CROSS/BLUE SHIELD | Admitting: Physician Assistant

## 2018-05-22 ENCOUNTER — Encounter: Payer: Self-pay | Admitting: Physician Assistant

## 2018-05-22 VITALS — BP 120/80 | HR 86 | Temp 98.3°F | Resp 16 | Wt 240.0 lb

## 2018-05-22 DIAGNOSIS — F329 Major depressive disorder, single episode, unspecified: Secondary | ICD-10-CM | POA: Diagnosis not present

## 2018-05-22 DIAGNOSIS — R21 Rash and other nonspecific skin eruption: Secondary | ICD-10-CM | POA: Diagnosis not present

## 2018-05-22 DIAGNOSIS — F419 Anxiety disorder, unspecified: Secondary | ICD-10-CM

## 2018-05-22 DIAGNOSIS — F32A Depression, unspecified: Secondary | ICD-10-CM

## 2018-05-22 MED ORDER — TRIAMCINOLONE ACETONIDE 0.1 % EX CREA: 1.0000 "application " | TOPICAL_CREAM | Freq: Two times a day (BID) | CUTANEOUS | 0 refills | Status: DC
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
## 2018-05-23 NOTE — Patient Instructions (Signed)

## 2018-07-11 ENCOUNTER — Other Ambulatory Visit: Payer: Self-pay | Admitting: Physician Assistant

## 2018-07-11 DIAGNOSIS — F329 Major depressive disorder, single episode, unspecified: Secondary | ICD-10-CM

## 2018-07-11 DIAGNOSIS — F419 Anxiety disorder, unspecified: Principal | ICD-10-CM

## 2018-07-11 DIAGNOSIS — F32A Depression, unspecified: Secondary | ICD-10-CM

## 2018-07-27 ENCOUNTER — Other Ambulatory Visit: Payer: Self-pay | Admitting: Physician Assistant

## 2018-07-27 DIAGNOSIS — F329 Major depressive disorder, single episode, unspecified: Secondary | ICD-10-CM

## 2018-07-27 DIAGNOSIS — F32A Depression, unspecified: Secondary | ICD-10-CM

## 2018-07-27 DIAGNOSIS — F419 Anxiety disorder, unspecified: Principal | ICD-10-CM

## 2018-07-27 MED ORDER — ESCITALOPRAM OXALATE 20 MG PO TABS: 20.0000 mg | ORAL_TABLET | Freq: Every day | ORAL | 1 refills | Status: DC

## 2018-07-27 NOTE — Telephone Encounter (Signed)
Needs refill on   Escitalopram 20 mg  Pt states she is completely out.  [facility]  CB#  [phone]  Thanks  Barth Kirksteri

## 2018-09-06 ENCOUNTER — Encounter: Payer: Self-pay | Admitting: Physician Assistant

## 2018-09-06 ENCOUNTER — Ambulatory Visit (INDEPENDENT_AMBULATORY_CARE_PROVIDER_SITE_OTHER): Payer: Medicaid Other | Admitting: Physician Assistant

## 2018-09-06 VITALS — BP 100/66 | HR 83 | Temp 98.3°F | Resp 16 | Wt 236.0 lb

## 2018-09-06 DIAGNOSIS — F419 Anxiety disorder, unspecified: Secondary | ICD-10-CM | POA: Diagnosis not present

## 2018-09-06 DIAGNOSIS — G5601 Carpal tunnel syndrome, right upper limb: Secondary | ICD-10-CM

## 2018-09-06 DIAGNOSIS — Z30019 Encounter for initial prescription of contraceptives, unspecified: Secondary | ICD-10-CM | POA: Diagnosis not present

## 2018-09-06 DIAGNOSIS — J069 Acute upper respiratory infection, unspecified: Secondary | ICD-10-CM | POA: Diagnosis not present

## 2018-09-06 MED ORDER — HYDROXYZINE HCL 25 MG PO TABS: 25.0000 mg | ORAL_TABLET | Freq: Every day | ORAL | 0 refills | Status: DC | PRN

## 2018-09-06 MED ORDER — ETONOGESTREL-ETHINYL ESTRADIOL 0.12-0.015 MG/24HR VA RING
VAGINAL_RING | VAGINAL | 12 refills | Status: DC
Start: 2018-09-06 — End: 2018-11-05

## 2018-09-06 NOTE — Patient Instructions (Signed)
Cock up wrist splint for carpal tunnel - wear at bed every night   Estradiol vaginal ring (Estring) What is this medicine? ESTRADIOL (es tra DYE ole) vaginal ring is an insert that contains a female hormone. This medicine helps relieve symptoms of vaginal irritation and dryness that occurs in some women during menopause. This medicine may be used for other purposes; ask your health care provider or pharmacist if you have questions. COMMON BRAND NAME(S): Estring What should I tell my health care provider before I take this medicine? They need to know if you have any of these conditions: -abnormal vaginal bleeding -blood vessel disease or blood clots -breast, cervical, endometrial, ovarian, liver, or uterine cancer -dementia -diabetes -gallbladder disease -heart disease or recent heart attack -high blood pressure -high cholesterol -high level of calcium in the blood -hysterectomy -kidney disease -liver disease -migraine headaches -protein C deficiency -protein S deficiency -stroke -systemic lupus erythematosus (SLE) -tobacco smoker -an unusual or allergic reaction to estrogens, other hormones, medicines, foods, dyes, or preservatives -pregnant or trying to get pregnant -breast-feeding How should I use this medicine? This medicine may be inserted by you or your physician. Follow the directions that are included with your prescription. If you are unsure how to insert the ring, contact your doctor or health care professional. The vaginal ring should remain in place for 90 days. After 90 days you should replace your old ring and insert a new one. Do not stop using except on the advice of your doctor or health care professional. Contact your pediatrician regarding the use of this medicine in children. Special care may be needed. A patient package insert for the product will be given with each prescription and refill. Read this sheet carefully each time. The sheet may change  frequently. Overdosage: If you think you have taken too much of this medicine contact a poison control center or emergency room at once. NOTE: This medicine is only for you. Do not share this medicine with others. What if I miss a dose? If you miss a dose, use it as soon as you can. If it is almost time for your next dose, use only that dose. Do not use double or extra doses. What may interact with this medicine? Do not take this medicine with any of the following medications: -aromatase inhibitors like aminoglutethimide, anastrozole, exemestane, letrozole, testolactone, vorozole This medicine may also interact with the following medications: -carbamazepine -certain antibiotics used to treat infections -certain barbiturates used for inducing sleep or treating seizures -grapefruit juice -medicines for fungus infections like itraconazole and ketoconazole -raloxifene or tamoxifen -rifabutin, rifampin, or rifapentine -ritonavir -St. John's Wort This list may not describe all possible interactions. Give your health care provider a list of all the medicines, herbs, non-prescription drugs, or dietary supplements you use. Also tell them if you smoke, drink alcohol, or use illegal drugs. Some items may interact with your medicine. What should I watch for while using this medicine? Visit your doctor or health care professional for regular checks on your progress. You will need a regular breast and pelvic exam and Pap smear while on this medicine. You should also discuss the need for regular mammograms with your health care professional, and follow his or her guidelines for these tests. This medicine can make your body retain fluid, making your fingers, hands, or ankles swell. Your blood pressure can go up. Contact your doctor or health care professional if you feel you are retaining fluid. If you have any reason to think  you are pregnant, stop taking this medicine right away and contact your doctor or  health care professional. Smoking increases the risk of getting a blood clot or having a stroke while you are taking this medicine, especially if you are more than 29 years old. You are strongly advised not to smoke. If you wear contact lenses and notice visual changes, or if the lenses begin to feel uncomfortable, consult your eye doctor or health care professional. This medicine can increase the risk of developing a condition (endometrial hyperplasia) that may lead to cancer of the lining of the uterus. Taking progestins, another hormone drug, with this medicine lowers the risk of developing this condition. Therefore, if your uterus has not been removed (by a hysterectomy), your doctor may prescribe a progestin for you to take together with your estrogen. You should know, however, that taking estrogens with progestins may have additional health risks. You should discuss the use of estrogens and progestins with your health care professional to determine the benefits and risks for you. If you are going to have surgery, you may need to stop taking this medicine. Consult your health care professional for advice before you schedule the surgery. You may bathe or participate in other activities while using this medicine. You do not need to remove the vaginal ring during sexual or other activities unless you are more comfortable doing so. Within the 90-day dosage period, you may remove the vaginal ring, rinse it with clean lukewarm (not hot or boiling) water, and re-insert the ring as needed. What side effects may I notice from receiving this medicine? Side effects that you should report to your doctor or health care professional as soon as possible: -allergic reactions like skin rash, itching or hives, swelling of the face, lips, or tongue -breast tissue changes or discharge -signs and symptoms of a blood clot such as breathing problems; changes in vision; chest pain; severe, sudden headache; pain, swelling,  warmth in the leg; trouble speaking; sudden numbness or weakness of the face, arm or leg -signs and symptoms of infection like fever or chills; vomiting; diarrhea; muscle pain; dizziness; or a red, sunburn-like rash on face and body -signs and symptoms of liver injury like dark yellow or brown urine; general ill feeling or flu-like symptoms; light-colored stools; loss of appetite; nausea; right upper belly pain; unusually weak or tired; yellowing of the eyes or skin -symptoms of bowel blockage like constipation, abdominal swelling, abdominal pain, inability to pass gas or have a bowel movement -symptoms of vaginal infection like itching, irritation or unusual discharge -unusual or increased vaginal bleeding -vaginal pain or soreness, redness, swelling Side effects that usually do not require medical attention (report to your doctor or health care professional if they continue or are bothersome): -breast tenderness -fluid retention -hair loss -headache -nausea -upset stomach -vaginal spotting This list may not describe all possible side effects. Call your doctor for medical advice about side effects. You may report side effects to FDA at 1-800-FDA-1088. Where should I keep my medicine? Keep out of the reach of children. Store at room temperature between 15 and 25 degrees C (59 and 77 degrees F). Throw away any unused medicine after the expiration date. NOTE: This sheet is a summary. It may not cover all possible information. If you have questions about this medicine, talk to your doctor, pharmacist, or health care provider.  2018 Elsevier/Gold Standard (2014-08-18 13:20:25)

## 2018-09-06 NOTE — Progress Notes (Signed)
Patient: Brandy Becker Female    DOB: [birth year]   27 y.o.   MRN: [id number] Visit Date: 09/10/2018  Today's Provider: Trinna Post, PA-C   Chief Complaint  Patient presents with  . Sore Throat   Subjective:    HPI Patient here today c/o sore throat ongoing several days that has improved, reports she does not have her tonsils. Denies fevers, chills. Reports ulcer in mouth. Patient reports she works at [employer] and would like to get tested for Mumps. Previous MMR titers showed immunity to all three illnesses.   Patient c/o numbness on middle and ring finger on right hand. It is worse when she wakes up, feels she often has to shake her hand out.   Reports she is doing well on Lexapro but experiences several flares of anxiety every once in a while that she describes as extreme panic.  Interested in starting birth control. Does have heavy periods. Denies history of blood clot, heart attack, stroke, migraine, current smoking. Currently having her period.      Allergies  Allergen Reactions  . Oxycodone Other (See Comments)    Altered mental status     Current Outpatient Medications:  .  escitalopram (LEXAPRO) 20 MG tablet, Take 1 tablet (20 mg total) by mouth daily., Disp: 90 tablet, Rfl: 1 .  etonogestrel-ethinyl estradiol (NUVARING) 0.12-0.015 MG/24HR vaginal ring, Insert vaginally and leave in place for 3 consecutive weeks, then remove for 1 week if desiring menstrual cycle. If not, replace ring., Disp: 3 each, Rfl: 12 .  hydrOXYzine (ATARAX/VISTARIL) 25 MG tablet, Take 1 tablet (25 mg total) by mouth daily as needed., Disp: 30 tablet, Rfl: 0 .  triamcinolone cream (KENALOG) 0.1 %, Apply 1 application topically 2 (two) times daily. (Patient not taking: Reported on 09/06/2018), Disp: 30 g, Rfl: 0  Review of Systems  HENT: Positive for rhinorrhea and sore throat.   Respiratory: Positive for cough.   Neurological: Positive for numbness.    Social History   Tobacco  Use  . Smoking status: Former Research scientist (life sciences)  . Smokeless tobacco: Never Used  Substance Use Topics  . Alcohol use: Yes    Alcohol/week: 0.0 standard drinks   Objective:   BP 100/66 (BP Location: Left Arm, Patient Position: Sitting, Cuff Size: Normal)   Pulse 83   Temp 98.3 F (36.8 C) (Oral)   Resp 16   Wt 236 lb (107 kg)   SpO2 98%   BMI 38.09 kg/m  Vitals:   09/06/18 1216  BP: 100/66  Pulse: 83  Resp: 16  Temp: 98.3 F (36.8 C)  TempSrc: Oral  SpO2: 98%  Weight: 236 lb (107 kg)     Physical Exam  Constitutional: She appears well-developed and well-nourished. She does not appear ill.  HENT:  Right Ear: Tympanic membrane and ear canal normal.  Left Ear: Tympanic membrane and ear canal normal.  Mouth/Throat: Uvula is midline and mucous membranes are normal. Oral lesions present. No oropharyngeal exudate, posterior oropharyngeal edema or posterior oropharyngeal erythema.  There appear to be two ulcers on the sides of her tongue.   Neck: Normal range of motion. Neck supple.  Cardiovascular: Normal rate, regular rhythm and normal heart sounds.  Musculoskeletal:  Phalen's positive right hand.   Skin: Skin is warm and dry.  Psychiatric: She has a normal mood and affect. Her behavior is normal.        Assessment & Plan:     1. Encounter for initial prescription  of contraceptives, unspecified contraceptive  Counseled she may use continuously to avoid menstrual cycle. May leave out for <3 hours for sexual activity if desired.   - etonogestrel-ethinyl estradiol (NUVARING) 0.12-0.015 MG/24HR vaginal ring; Insert vaginally and leave in place for 3 consecutive weeks, then remove for 1 week if desiring menstrual cycle. If not, replace ring.  Dispense: 3 each; Refill: 12  2. Anxiety  - hydrOXYzine (ATARAX/VISTARIL) 25 MG tablet; Take 1 tablet (25 mg total) by mouth daily as needed.  Dispense: 30 tablet; Refill: 0  3. Viral URI  MMR titers show immunity.  Counseled regarding  signs and symptoms of viral and bacterial respiratory infections. Advised to call or return for additional evaluation if she develops any sign of bacterial infection, or if current symptoms last longer than 10 days.    4. Carpal tunnel syndrome of right wrist  Counseled on cock-up wrist splint x 6 weeks at night, anti-inflammatories, steroid injections and possible surgery. Will start with wrist splint and NSAIDs, will refer to ortho if no improvement or worsening after conservative treatment.  Return in about 3 months (around 12/07/2018).  The entirety of the information documented in the History of Present Illness, Review of Systems and Physical Exam were personally obtained by me. Portions of this information were initially documented by Lynford Humphrey, CMA and reviewed by me for thoroughness and accuracy.          Trinna Post, PA-C  [facility]

## 2018-10-01 ENCOUNTER — Other Ambulatory Visit: Payer: Self-pay | Admitting: Physician Assistant

## 2018-10-01 DIAGNOSIS — F419 Anxiety disorder, unspecified: Secondary | ICD-10-CM

## 2018-11-05 ENCOUNTER — Other Ambulatory Visit: Payer: Self-pay

## 2018-11-05 DIAGNOSIS — Z30019 Encounter for initial prescription of contraceptives, unspecified: Secondary | ICD-10-CM

## 2018-11-05 MED ORDER — ETONOGESTREL-ETHINYL ESTRADIOL 0.12-0.015 MG/24HR VA RING: VAGINAL_RING | VAGINAL | 12 refills | Status: DC

## 2018-11-05 NOTE — Telephone Encounter (Signed)
Patient called requesting [place] on [facility] control[facility] [place] Thanks!

## 2018-11-14 ENCOUNTER — Telehealth: Payer: Self-pay | Admitting: Physician Assistant

## 2018-11-14 DIAGNOSIS — Z3009 Encounter for other general counseling and advice on contraception: Secondary | ICD-10-CM

## 2018-11-14 MED ORDER — ETONOGESTREL-ETHINYL ESTRADIOL 0.12-0.015 MG/24HR VA RING: VAGINAL_RING | VAGINAL | 3 refills | Status: DC

## 2018-11-14 NOTE — Telephone Encounter (Signed)
Pt has questions regarding her etonogestrel-ethinyl estradiol (NUVARING) 0.12-0.015 MG/24HR vaginal ring.  Please advise.  Thanks, [employer]

## 2018-11-14 NOTE — Telephone Encounter (Signed)
Call the patient and find out the question please.

## 2018-11-14 NOTE — Telephone Encounter (Signed)
We can do it so she leaves NuvaRing in for 4 continuous weeks and replaces with new ring on the next month. Will send in new Rx.

## 2018-11-14 NOTE — Telephone Encounter (Signed)
Patient reports that she messed her calender up of when to change her nuvaring. Patient reports she took the ring out one week to soon. And is now out of refills. Patient reports that she was wanting to do a full 4 weeks to prevent having periods. Patient reports that since taking out the ring too soon she now is having heavy bleeding and cramping a lot.  Please advise. sd

## 2018-11-15 NOTE — Telephone Encounter (Signed)
Left detailed message for patient.

## 2018-11-22 ENCOUNTER — Ambulatory Visit (INDEPENDENT_AMBULATORY_CARE_PROVIDER_SITE_OTHER): Payer: Medicaid Other | Admitting: Physician Assistant

## 2018-11-22 ENCOUNTER — Encounter: Payer: Self-pay | Admitting: Physician Assistant

## 2018-11-22 VITALS — BP 121/75 | HR 98 | Temp 98.1°F | Resp 16 | Wt 232.8 lb

## 2018-11-22 DIAGNOSIS — F329 Major depressive disorder, single episode, unspecified: Secondary | ICD-10-CM

## 2018-11-22 DIAGNOSIS — F419 Anxiety disorder, unspecified: Secondary | ICD-10-CM

## 2018-11-22 DIAGNOSIS — F32A Depression, unspecified: Secondary | ICD-10-CM

## 2018-11-22 NOTE — Progress Notes (Signed)
Patient: Brandy Becker Female    DOB: 05-26-1991   28 y.o.   MRN: [id number] Visit Date: 12/06/2018  Today's Provider: Trey SailorsAdriana M Peniel Hass, PA-C   Chief Complaint  Patient presents with  . Anxiety   Subjective:     HPI  Follow up for anxiety  The patient was last seen for this 4 months ago. Changes made at last visit include no changes.  She reports excellent compliance with treatment. She feels that condition is Improved. She is not having side effects.   Reports some dizziness, does not describe the room spinning. Will happen at random, sometimes when she is driving.   ------------------------------------------------------------------------------------    Allergies  Allergen Reactions  . Oxycodone Other (See Comments)    Altered mental status     Current Outpatient Medications:  .  escitalopram (LEXAPRO) 20 MG tablet, Take 1 tablet (20 mg total) by mouth daily., Disp: 90 tablet, Rfl: 1 .  etonogestrel-ethinyl estradiol (NUVARING) 0.12-0.015 MG/24HR vaginal ring, Insert vaginally and leave in place for 4 weeks to skip menstrual cycle. Replace with new ring for another 4 weeks., Disp: 3 each, Rfl: 3 .  amoxicillin-clavulanate (AUGMENTIN) 875-125 MG tablet, Take 1 tablet by mouth 2 (two) times daily for 7 days., Disp: 14 tablet, Rfl: 0 .  benzonatate (TESSALON PERLES) 100 MG capsule, Take 1 capsule (100 mg total) by mouth 3 (three) times daily as needed for up to 7 days., Disp: 21 capsule, Rfl: 0 .  hydrOXYzine (ATARAX/VISTARIL) 25 MG tablet, TAKE 1 TABLET (25 MG TOTAL) BY MOUTH DAILY AS NEEDED., Disp: 30 tablet, Rfl: 0  Review of Systems  Constitutional: Negative.   Respiratory: Negative.   Psychiatric/Behavioral: Negative.     Social History   Tobacco Use  . Smoking status: Former Games developermoker  . Smokeless tobacco: Never Used  Substance Use Topics  . Alcohol use: Yes    Alcohol/week: 0.0 standard drinks      Objective:   BP 121/75 (BP Location: Left  Arm, Patient Position: Sitting, Cuff Size: Large)   Pulse 98   Temp 98.1 F (36.7 C) (Oral)   Resp 16   Wt 232 lb 12.8 oz (105.6 kg)   BMI 37.57 kg/m  Vitals:   11/22/18 1611  BP: 121/75  Pulse: 98  Resp: 16  Temp: 98.1 F (36.7 C)  TempSrc: Oral  Weight: 232 lb 12.8 oz (105.6 kg)     Physical Exam Constitutional:      Appearance: Normal appearance.  HENT:     Right Ear: Tympanic membrane and ear canal normal.     Left Ear: Tympanic membrane and ear canal normal.     Mouth/Throat:     Mouth: Mucous membranes are moist.     Pharynx: Oropharynx is clear.  Cardiovascular:     Rate and Rhythm: Normal rate and regular rhythm.     Heart sounds: Normal heart sounds.  Pulmonary:     Effort: Pulmonary effort is normal.     Breath sounds: Normal breath sounds.  Abdominal:     General: Abdomen is flat.     Palpations: Abdomen is soft.  Skin:    General: Skin is warm and dry.  Neurological:     Mental Status: She is alert and oriented to person, place, and time. Mental status is at baseline.  Psychiatric:        Mood and Affect: Mood normal.        Behavior: Behavior normal.  Assessment & Plan    1. Anxiety and depression  Doing well on current dose of lexapro 20 mg QD with no adverse effects. Continue current medication.  The entirety of the information documented in the History of Present Illness, Review of Systems and Physical Exam were personally obtained by me. Portions of this information were initially documented by Rondel Baton, CMA and reviewed by me for thoroughness and accuracy.   Return in about 6 months (around 05/23/2019) for anxiety .      [name], PA-C  [facility]

## 2018-11-22 NOTE — Patient Instructions (Signed)

## 2018-12-02 ENCOUNTER — Other Ambulatory Visit: Payer: Self-pay | Admitting: Physician Assistant

## 2018-12-02 DIAGNOSIS — F419 Anxiety disorder, unspecified: Secondary | ICD-10-CM

## 2018-12-03 ENCOUNTER — Encounter: Payer: Self-pay | Admitting: Physician Assistant

## 2018-12-03 ENCOUNTER — Ambulatory Visit (INDEPENDENT_AMBULATORY_CARE_PROVIDER_SITE_OTHER): Payer: Self-pay | Admitting: Physician Assistant

## 2018-12-03 VITALS — BP 103/65 | HR 86 | Temp 97.7°F | Resp 16 | Wt 241.0 lb

## 2018-12-03 DIAGNOSIS — J0110 Acute frontal sinusitis, unspecified: Secondary | ICD-10-CM

## 2018-12-03 MED ORDER — BENZONATATE 100 MG PO CAPS: 100.0000 mg | ORAL_CAPSULE | Freq: Three times a day (TID) | ORAL | 0 refills | Status: AC | PRN

## 2018-12-03 MED ORDER — AMOXICILLIN-POT CLAVULANATE 875-125 MG PO TABS: 1.0000 | ORAL_TABLET | Freq: Two times a day (BID) | ORAL | 0 refills | Status: AC

## 2018-12-03 NOTE — Progress Notes (Signed)
Patient: Brandy Becker Female    DOB: 09/06/1991   28 y.o.   MRN: [id number] Visit Date: 12/03/2018  Today's Provider: [name], PA-C   Chief Complaint  Patient presents with  . URI   Subjective:     HPI Upper Respiratory Infection: Patient complains of symptoms of a URI, possible sinusitis. Symptoms include bilateral ear pain, congestion, cough and sore throat. Onset of symptoms was 10 days ago, gradually worsening since that time. She also c/o achiness, nasal congestion and non productive cough for the past 10 days .  She is drinking plenty of fluids. Evaluation to date: none. Treatment to date: cough suppressants and decongestants. Was seen on 11/22/2018 and has been steadily worsening since then.     Allergies  Allergen Reactions  . Oxycodone Other (See Comments)    Altered mental status     Current Outpatient Medications:  .  escitalopram (LEXAPRO) 20 MG tablet, Take 1 tablet (20 mg total) by mouth daily., Disp: 90 tablet, Rfl: 1 .  etonogestrel-ethinyl estradiol (NUVARING) 0.12-0.015 MG/24HR vaginal ring, Insert vaginally and leave in place for 4 weeks to skip menstrual cycle. Replace with new ring for another 4 weeks., Disp: 3 each, Rfl: 3 .  hydrOXYzine (ATARAX/VISTARIL) 25 MG tablet, TAKE 1 TABLET (25 MG TOTAL) BY MOUTH DAILY AS NEEDED., Disp: 30 tablet, Rfl: 0  Review of Systems  Constitutional: Positive for fatigue.  HENT: Positive for congestion, ear pain, rhinorrhea, sinus pressure, sinus pain and sore throat.   Respiratory: Positive for cough.     Social History   Tobacco Use  . Smoking status: Former Games developer  . Smokeless tobacco: Never Used  Substance Use Topics  . Alcohol use: Yes    Alcohol/week: 0.0 standard drinks      Objective:   BP 103/65 (BP Location: Left Arm, Patient Position: Sitting, Cuff Size: Normal)   Pulse 86   Temp 97.7 F (36.5 C) (Oral)   Resp 16   Wt 241 lb (109.3 kg)   SpO2 97%   BMI 38.90 kg/m  Vitals:   12/03/18 1457  BP: 103/65  Pulse: 86  Resp: 16  Temp: 97.7 F (36.5 C)  TempSrc: Oral  SpO2: 97%  Weight: 241 lb (109.3 kg)     Physical Exam Constitutional:      General: She is not in acute distress.    Appearance: She is well-developed. She is not diaphoretic.  HENT:     Right Ear: External ear normal.     Left Ear: External ear normal.     Nose:     Right Sinus: Maxillary sinus tenderness and frontal sinus tenderness present.     Left Sinus: Maxillary sinus tenderness and frontal sinus tenderness present.     Mouth/Throat:     Pharynx: No oropharyngeal exudate or posterior oropharyngeal erythema.  Eyes:     General:        Right eye: No discharge.        Left eye: No discharge.     Conjunctiva/sclera: Conjunctivae normal.  Neck:     Musculoskeletal: Neck supple.  Cardiovascular:     Rate and Rhythm: Normal rate and regular rhythm.  Pulmonary:     Effort: Pulmonary effort is normal.     Breath sounds: Normal breath sounds.  Lymphadenopathy:     Cervical: Cervical adenopathy present.  Skin:    General: Skin is warm and dry.  Neurological:     Mental Status: She is  alert and oriented to person, place, and time.  Psychiatric:        Behavior: Behavior normal.         Assessment & Plan    1. Acute non-recurrent frontal sinusitis  - amoxicillin-clavulanate (AUGMENTIN) 875-125 MG tablet; Take 1 tablet by mouth 2 (two) times daily for 7 days.  Dispense: 14 tablet; Refill: 0 - benzonatate (TESSALON PERLES) 100 MG capsule; Take 1 capsule (100 mg total) by mouth 3 (three) times daily as needed for up to 7 days.  Dispense: 21 capsule; Refill: 0  Return if symptoms worsen or fail to improve.  The entirety of the information documented in the History of Present Illness, Review of Systems and Physical Exam were personally obtained by me. Portions of this information were initially documented by Rondel BatonSulibeya Dimas, CMA and reviewed by me for thoroughness and accuracy.         Trey SailorsAdriana M Cade Olberding, PA-C  [facility]

## 2018-12-03 NOTE — Patient Instructions (Signed)

## 2018-12-07 ENCOUNTER — Other Ambulatory Visit: Payer: Self-pay | Admitting: Physician Assistant

## 2018-12-07 DIAGNOSIS — F419 Anxiety disorder, unspecified: Secondary | ICD-10-CM

## 2019-02-14 ENCOUNTER — Ambulatory Visit (INDEPENDENT_AMBULATORY_CARE_PROVIDER_SITE_OTHER): Payer: Self-pay | Admitting: Physician Assistant

## 2019-02-14 DIAGNOSIS — F32A Depression, unspecified: Secondary | ICD-10-CM

## 2019-02-14 DIAGNOSIS — F329 Major depressive disorder, single episode, unspecified: Secondary | ICD-10-CM

## 2019-02-14 DIAGNOSIS — Z3009 Encounter for other general counseling and advice on contraception: Secondary | ICD-10-CM

## 2019-02-14 DIAGNOSIS — F419 Anxiety disorder, unspecified: Secondary | ICD-10-CM

## 2019-02-14 MED ORDER — BUSPIRONE HCL 7.5 MG PO TABS: 7.5000 mg | ORAL_TABLET | Freq: Two times a day (BID) | ORAL | 2 refills | Status: DC

## 2019-02-14 MED ORDER — ESCITALOPRAM OXALATE 20 MG PO TABS: 20.0000 mg | ORAL_TABLET | Freq: Every day | ORAL | 1 refills | Status: DC

## 2019-02-14 MED ORDER — ETONOGESTREL-ETHINYL ESTRADIOL 0.12-0.015 MG/24HR VA RING: VAGINAL_RING | VAGINAL | 11 refills | Status: DC

## 2019-02-14 NOTE — Progress Notes (Signed)
Patient: Brandy BrookingBecky Dalia Female    DOB: Jan 25, 1991   28 y.o.   MRN: [id number] Visit Date: 02/14/2019  Today's Provider: Trey SailorsAdriana M Yomar Mejorado, PA-C   Chief Complaint  Patient presents with  . Anxiety  . Depression   Subjective:     HPI   Virtual Visit via Video Note  I connected with Brandy Becker on 02/14/19 at  2:00 PM EDT by a video enabled telemedicine application and verified that I am speaking with the correct person using two identifiers.   I discussed the limitations of evaluation and management by telemedicine and the availability of in person appointments. The patient expressed understanding and agreed to proceed.   Depression, Follow-up [facility] reports [facility] that we responded on refill that she needed an office visit. She  was last seen for this 4 months ago. Changes made at last visit include no changes.   She reports excellent compliance with treatment. She is not having side effects.   She reports excellent tolerance of treatment. Feels the medication is not longer working. Would like to try switching medications.  ------------------------------------------------------------------------    Allergies  Allergen Reactions  . Oxycodone Other (See Comments)    Altered mental status     Current Outpatient Medications:  .  escitalopram (LEXAPRO) 20 MG tablet, Take 1 tablet (20 mg total) by mouth daily., Disp: 90 tablet, Rfl: 1 .  etonogestrel-ethinyl estradiol (NUVARING) 0.12-0.015 MG/24HR vaginal ring, Insert vaginally and leave in place for 4 weeks to skip menstrual cycle. Replace with new ring for another 4 weeks., Disp: 3 each, Rfl: 3 .  hydrOXYzine (ATARAX/VISTARIL) 25 MG tablet, TAKE 1 TABLET (25 MG TOTAL) BY MOUTH DAILY AS NEEDED., Disp: 90 tablet, Rfl: 1  Review of Systems  Social History   Tobacco Use  . Smoking status: Former Games developermoker  . Smokeless tobacco: Never Used  Substance Use Topics  . Alcohol use: Yes    Alcohol/week: 0.0  standard drinks      Objective:   There were no vitals taken for this visit. There were no vitals filed for this visit.   Physical Exam Constitutional:      Appearance: Normal appearance.  Pulmonary:     Effort: Pulmonary effort is normal. No respiratory distress.  Skin:    General: Skin is warm and dry.  Neurological:     Mental Status: She is alert.  Psychiatric:        Mood and Affect: Mood normal.        Behavior: Behavior normal.         Assessment & Plan    1. Anxiety and depression  Will switch from Lexapro to buspar. Counseled that she may keep lexapro and add buspar but she would like to discontinue completely. Take lexapro 10 mg daily x 1 week and then can discontinue.  - busPIRone (BUSPAR) 7.5 MG tablet; Take 1 tablet (7.5 mg total) by mouth 2 (two) times daily for 30 days.  Dispense: 60 tablet; Refill: 2  2. Encounter for other general counseling or advice on contraception  - etonogestrel-ethinyl estradiol (NUVARING) 0.12-0.015 MG/24HR vaginal ring; Insert vaginally and leave in place for 4 weeks to skip menstrual cycle. Replace with new ring for another 4 weeks.  Dispense: 1 each; Refill: 11  The entirety of the information documented in the History of Present Illness, Review of Systems and Physical Exam were personally obtained by me. Portions of this information were initially documented by Hetty ElyJoseline Rosas,  CMA and reviewed by me for thoroughness and accuracy.  F/u 3 months for anxiety.       [name], PA-C  [facility]

## 2019-02-20 NOTE — Patient Instructions (Signed)

## 2019-02-21 ENCOUNTER — Telehealth: Payer: Self-pay

## 2019-02-21 NOTE — Telephone Encounter (Signed)
It can be from several things - it can be break through bleeding from the birth control. We'd have to check for pregnancy and infection. She'd need to be evaluated in the clinic. If she gets to the point where she has passed out she probably needs to be evaluated more emergently.

## 2019-02-21 NOTE — Telephone Encounter (Signed)
Patient called asking on advise on what to do. She states she is using generic Nuvaring. Patient was seen in the office on 02/14/2019. Patient states she has had heavy bleeding with some clots, abdominal cramping, headaches and lightheadedness. She says these symptoms started 4 days prior to the last visit. Patient wants to know what she should do since she has been bleeding for so long. She still has the Nuvaring inserted now.

## 2019-02-21 NOTE — Telephone Encounter (Signed)
Patient schedule for appointment tomorrow morning. She is soaking 2 pads an hour, some dizziness, and nausea. Advised her to go to ER if she feels like she is getting worse.

## 2019-02-22 ENCOUNTER — Other Ambulatory Visit: Payer: Self-pay

## 2019-02-22 ENCOUNTER — Other Ambulatory Visit: Payer: Self-pay | Admitting: Physician Assistant

## 2019-02-22 ENCOUNTER — Other Ambulatory Visit (HOSPITAL_COMMUNITY)
Admission: RE | Admit: 2019-02-22 | Discharge: 2019-02-22 | Disposition: A | Discharge status: Home / Self Care | Payer: Self-pay | Source: Ambulatory Visit | Attending: Physician Assistant | Admitting: Physician Assistant

## 2019-02-22 ENCOUNTER — Ambulatory Visit (INDEPENDENT_AMBULATORY_CARE_PROVIDER_SITE_OTHER): Payer: Self-pay | Admitting: Physician Assistant

## 2019-02-22 ENCOUNTER — Encounter: Payer: Self-pay | Admitting: Physician Assistant

## 2019-02-22 VITALS — BP 132/90 | HR 75 | Temp 98.2°F | Resp 18 | Wt 245.2 lb

## 2019-02-22 DIAGNOSIS — N939 Abnormal uterine and vaginal bleeding, unspecified: Secondary | ICD-10-CM

## 2019-02-22 DIAGNOSIS — R42 Dizziness and giddiness: Secondary | ICD-10-CM

## 2019-02-22 MED ORDER — ETHYNODIOL DIAC-ETH ESTRADIOL 1-35 MG-MCG PO TABS: 1.0000 | ORAL_TABLET | Freq: Every day | ORAL | 3 refills | Status: DC

## 2019-02-22 NOTE — Progress Notes (Signed)
Patient: Brandy Becker Female    DOB: [birth year]   28 y.o.   MRN: [id number] Visit Date: 02/22/2019  Today's Provider: [name], PA-C   Chief Complaint  Patient presents with  . Vaginal Bleeding   Subjective:     HPI Patient reports 2 weeks of heavy bleeding after using the NuVaring. She reports bleeding began on 02/10/2019. She reports she received a generic form of Nuvarin, left it in 4 days past the three weeks and began to have bleeding. She replaced the nuvaring and continued to have bleeding. She is soaking at least 2 pads an hour. Bleeding gets heavier throughout the day. She is experiencing cramping and abdominal pain. Currently taking Advil for the pain with no relief. She denies new sexual partners, vaginal discharge, dyspareunia. She denies fevers, chills, nausea, vomiting. She denies history of abnormal pap smears. She denies history of uterine fibroids.     Allergies  Allergen Reactions  . Oxycodone Other (See Comments)    Altered mental status     Current Outpatient Medications:  .  busPIRone (BUSPAR) 7.5 MG tablet, Take 1 tablet (7.5 mg total) by mouth 2 (two) times daily for 30 days., Disp: 60 tablet, Rfl: 2 .  etonogestrel-ethinyl estradiol (NUVARING) 0.12-0.015 MG/24HR vaginal ring, Insert vaginally and leave in place for 4 weeks to skip menstrual cycle. Replace with new ring for another 4 weeks., Disp: 1 each, Rfl: 11 .  hydrOXYzine (ATARAX/VISTARIL) 25 MG tablet, TAKE 1 TABLET (25 MG TOTAL) BY MOUTH DAILY AS NEEDED., Disp: 90 tablet, Rfl: 1 .  vitamin C (ASCORBIC ACID) 250 MG tablet, Take 250 mg by mouth daily., Disp: , Rfl:   Review of Systems  Gastrointestinal: Positive for abdominal pain.  Genitourinary: Positive for menstrual problem, vaginal bleeding and vaginal pain.    Social History   Tobacco Use  . Smoking status: Former Games developer  . Smokeless tobacco: Never Used  Substance Use Topics  . Alcohol use: Yes    Alcohol/week: 0.0  standard drinks      Objective:   BP 132/90 (BP Location: Left Arm, Patient Position: Sitting, Cuff Size: Normal)   Pulse 75   Temp 98.2 F (36.8 C) (Oral)   Resp 18   Wt 245 lb 3.2 oz (111.2 kg)   BMI 39.58 kg/m  Vitals:   02/22/19 0834  BP: 132/90  Pulse: 75  Resp: 18  Temp: 98.2 F (36.8 C)  TempSrc: Oral  Weight: 245 lb 3.2 oz (111.2 kg)     Physical Exam Exam conducted with a chaperone present.  Constitutional:      Appearance: Normal appearance.  Cardiovascular:     Rate and Rhythm: Normal rate.  Abdominal:     General: Bowel sounds are normal.     Tenderness: There is no abdominal tenderness.  Genitourinary:    Exam position: Lithotomy position.     Vagina: Normal.     Cervix: Cervical bleeding present. No discharge.     Comments: Slight bleeding from cervical os  Skin:    General: Skin is warm and dry.     Coloration: Skin is not pale.  Neurological:     General: No focal deficit present.     Mental Status: She is alert and oriented to person, place, and time.  Psychiatric:        Mood and Affect: Mood normal.        Behavior: Behavior normal.  Assessment & Plan    1. Vaginal bleeding  Some slight bleeding evident on pelvic exam, but otherwise pelvic exam is normal and cervix is not friable or otherwise not visible infected. Urine pregnancy negative. Will check labs as below to assess for anemia and also cervicitis. Will changed her nuvaring to OCP in order to mitigate bleeding. Reviewed ultrasound from 2016 which shows some prominent vascularity and possible retained products of conception or submucosal fibroid, but patient reports this is from a failed pregnancy and she has never otherwise been told she has fibroids. Will start OCP today and schedule US to assess for fibroid or other source of heavy bleeding.   - Cervicovaginal ancillary only - CBC with Differential - Fe+TIBC+Fer - US Transvaginal Non-OB; Future - US Pelvis Complete;  Future - ethynodiol-ethinyl estradiol [place](KELNOR 1/35) 1-35 MG-MCG tablet; Take 1 tablet by mouth daily.  Dispense: 3 Package; Refill: 3 - Beta HCG, Quant - Ambulatory referral to Obstetrics / Gynecology - Pregnancy, urine  2. Dizziness  - CBC with Differential - Fe+TIBC+Fer - Ambulatory referral to Obstetrics / Gynecology  The entirety of the information documented in the History of Present Illness, Review of Systems and Physical Exam were personally obtained by me. Portions of this information were initially documented by Lexine BatonStacy Baldwin, LPN and reviewed by me for thoroughness and accuracy.   F/u PRN.       Trey SailorsAdriana M Pollak, PA-C  [facility]

## 2019-02-22 NOTE — Patient Instructions (Signed)
Abnormal Uterine Bleeding  Abnormal uterine bleeding means bleeding more than usual from your uterus. It can include:   Bleeding between periods.   Bleeding after sex.   Bleeding that is heavier than normal.   Periods that last longer than usual.   Bleeding after you have stopped having your period (menopause).  There are many problems that may cause this. You should see a doctor for any kind of bleeding that is not normal. Treatment depends on the cause of the bleeding.  Follow these instructions at home:   Watch your condition for any changes.   Do not use tampons, douche, or have sex, if your doctor tells you not to.   Change your pads often.   Get regular well-woman exams. Make sure they include a pelvic exam and cervical cancer screening.   Keep all follow-up visits as told by your doctor. This is important.  Contact a doctor if:   The bleeding lasts more than one week.   You feel dizzy at times.   You feel like you are going to throw up (nauseous).   You throw up.  Get help right away if:   You pass out.   You have to change pads every hour.   You have belly (abdominal) pain.   You have a fever.   You get sweaty.   You get weak.   You passing large blood clots from your vagina.  Summary   Abnormal uterine bleeding means bleeding more than usual from your uterus.   There are many problems that may cause this. You should see a doctor for any kind of bleeding that is not normal.   Treatment depends on the cause of the bleeding.  This information is not intended to replace advice given to you by your health care provider. Make sure you discuss any questions you have with your health care provider.  Document Released: 08/21/2009 Document Revised: 10/18/2016 Document Reviewed: 10/18/2016  Elsevier Interactive Patient Education  2019 Elsevier Inc[employer]

## 2019-02-23 LAB — CBC WITH DIFFERENTIAL/PLATELET
Basophils Absolute: 0.1 10*3/uL (ref 0.0–0.2)
Basos: 1 %
EOS (ABSOLUTE): 0.2 10*3/uL (ref 0.0–0.4)
Eos: 2 %
Hematocrit: 36.8 % (ref 34.0–46.6)
Hemoglobin: 12 g/dL (ref 11.1–15.9)
Immature Grans (Abs): 0.1 10*3/uL (ref 0.0–0.1)
Immature Granulocytes: 1 %
Lymphocytes Absolute: 3.3 10*3/uL — ABNORMAL HIGH (ref 0.7–3.1)
Lymphs: 35 %
MCH: 28.2 pg (ref 26.6–33.0)
MCHC: 32.6 g/dL (ref 31.5–35.7)
MCV: 87 fL (ref 79–97)
Monocytes Absolute: 0.6 10*3/uL (ref 0.1–0.9)
Monocytes: 6 %
Neutrophils Absolute: 5.3 10*3/uL (ref 1.4–7.0)
Neutrophils: 55 %
Platelets: 352 10*3/uL (ref 150–450)
RBC: 4.25 x10E6/uL (ref 3.77–5.28)
RDW: 12.5 % (ref 11.7–15.4)
WBC: 9.4 10*3/uL (ref 3.4–10.8)

## 2019-02-23 LAB — PREGNANCY, URINE: Preg Test, Ur: NEGATIVE

## 2019-02-23 LAB — IRON,TIBC AND FERRITIN PANEL
Ferritin: 53 ng/mL (ref 15–150)
Iron Saturation: 12 % — ABNORMAL LOW (ref 15–55)
Iron: 38 ug/dL (ref 27–159)
Total Iron Binding Capacity: 305 ug/dL (ref 250–450)
UIBC: 267 ug/dL (ref 131–425)

## 2019-02-23 LAB — BETA HCG QUANT (REF LAB): hCG Quant: <1 m[IU]/mL

## 2019-02-25 ENCOUNTER — Telehealth: Payer: Self-pay | Admitting: *Deleted

## 2019-02-25 NOTE — Telephone Encounter (Signed)
Patient called office requesting lab results. Please advise?  

## 2019-02-26 ENCOUNTER — Telehealth: Payer: Self-pay

## 2019-02-26 LAB — CERVICOVAGINAL ANCILLARY ONLY
Bacterial vaginitis: POSITIVE — AB
Candida vaginitis: NEGATIVE
Chlamydia: NEGATIVE
Neisseria Gonorrhea: NEGATIVE
Trichomonas: NEGATIVE

## 2019-02-26 NOTE — Telephone Encounter (Signed)
Please see result note 

## 2019-02-26 NOTE — Telephone Encounter (Signed)
-----   Message from [name], [place] sent at 02/26/2019  9:12 AM EDT ----- Iron is low normal, would recommend taking ferrous sulfate 325 mg three times daily. Blood counts looked okay, no anemia. Pregnancy testing is negative.

## 2019-02-26 NOTE — Telephone Encounter (Signed)
Patient was advised.  

## 2019-02-26 NOTE — Telephone Encounter (Signed)
Patient was advised and stated she was still having cramps and taking 1000 mg Tylenol for pain/ cramps. Provider was advised in person.

## 2019-02-27 ENCOUNTER — Telehealth: Payer: Self-pay

## 2019-02-27 MED ORDER — METRONIDAZOLE 500 MG PO TABS: 500.0000 mg | ORAL_TABLET | Freq: Two times a day (BID) | ORAL | 0 refills | Status: DC

## 2019-02-27 NOTE — Telephone Encounter (Signed)
-----   Message from [name], [place] sent at 02/27/2019 11:02 AM EDT ----- Swab negative for STI and yeast, positive for bacterial vaginitis. OK to send oral flagyll for this? If so, flagyl 500 mg BID x 7 days to her pharmacy please. Does not mix with alcohol.

## 2019-02-27 NOTE — Telephone Encounter (Signed)
Patient was advised and medication send to pharmacy. 

## 2019-03-07 ENCOUNTER — Ambulatory Visit: Payer: Self-pay

## 2019-03-10 ENCOUNTER — Other Ambulatory Visit: Payer: Self-pay | Admitting: Physician Assistant

## 2019-03-10 DIAGNOSIS — F419 Anxiety disorder, unspecified: Principal | ICD-10-CM

## 2019-03-10 DIAGNOSIS — F32A Depression, unspecified: Secondary | ICD-10-CM

## 2019-03-10 DIAGNOSIS — F329 Major depressive disorder, single episode, unspecified: Secondary | ICD-10-CM

## 2019-03-11 NOTE — Telephone Encounter (Signed)
Patient is requesting 90 supply. L.O.V. 02/22/2019, please advise.

## 2019-04-11 ENCOUNTER — Telehealth: Payer: Self-pay | Admitting: Physician Assistant

## 2019-04-11 ENCOUNTER — Ambulatory Visit (INDEPENDENT_AMBULATORY_CARE_PROVIDER_SITE_OTHER): Payer: Self-pay | Admitting: Physician Assistant

## 2019-04-11 ENCOUNTER — Other Ambulatory Visit: Payer: Self-pay

## 2019-04-11 DIAGNOSIS — F419 Anxiety disorder, unspecified: Secondary | ICD-10-CM

## 2019-04-11 DIAGNOSIS — F32A Depression, unspecified: Secondary | ICD-10-CM

## 2019-04-11 DIAGNOSIS — F329 Major depressive disorder, single episode, unspecified: Secondary | ICD-10-CM

## 2019-04-11 MED ORDER — ESCITALOPRAM OXALATE 20 MG PO TABS: 20.0000 mg | ORAL_TABLET | Freq: Every day | ORAL | 0 refills | Status: DC

## 2019-04-11 NOTE — Patient Instructions (Signed)

## 2019-04-11 NOTE — Telephone Encounter (Signed)
Scheduled for today.

## 2019-04-11 NOTE — Telephone Encounter (Signed)
Pt wants to know if she can change her anxiety and depression medication.  She does not feel as if is working right now  CB#  [phone]  [employer]

## 2019-04-11 NOTE — Telephone Encounter (Signed)
Schedule her for virtual visit or she can come to office if asymptomatic.

## 2019-04-11 NOTE — Progress Notes (Signed)
       Patient: Brandy Becker Female    DOB: 07/10/1991   28 y.o.   MRN: [id number] Visit Date: 04/11/2019  Today's Provider: [name], PA-C   Chief Complaint  Patient presents with  . Follow-up   Subjective:    Virtual Visit via Telephone Note  I connected with Brandy Becker on 04/11/19 at  2:40 PM EDT by telephone and verified that I am speaking with the correct person using two identifiers.   I discussed the limitations, risks, security and privacy concerns of performing an evaluation and management service by telephone and the availability of in person appointments. I also discussed with the patient that there may be a patient responsible charge related to this service. The patient expressed understanding and agreed to proceed.   Patient location: home Provider location: [facility]/home office  Persons involved in the visit: patient, provider    HPI  Patient with history of anxiety and depression. Last visit for this issue she requested to switch from her medication of Lexapro 20 mg daily to a new medication. Patient requesting to increase Buspirone from 7.5 mg. Patient reports symptoms have not improved or improved only minimally on current dose. She has an upcoming appointment with her previous counselor scheduled for this month.  Allergies  Allergen Reactions  . Oxycodone Other (See Comments)    Altered mental status     Current Outpatient Medications:  .  busPIRone (BUSPAR) 7.5 MG tablet, Take 7.5 mg by mouth 2 (two) times daily., Disp: , Rfl:  .  ethynodiol-ethinyl estradiol (KELNOR 1/35) 1-35 MG-MCG tablet, Take 1 tablet by mouth daily., Disp: 3 Package, Rfl: 3 .  hydrOXYzine (ATARAX/VISTARIL) 25 MG tablet, TAKE 1 TABLET (25 MG TOTAL) BY MOUTH DAILY AS NEEDED., Disp: 90 tablet, Rfl: 1 .  vitamin C (ASCORBIC ACID) 250 MG tablet, Take 250 mg by mouth daily., Disp: , Rfl:   Review of Systems  Constitutional: Negative.   Cardiovascular:  Negative.   Psychiatric/Behavioral: Positive for agitation. The patient is nervous/anxious.     Social History   Tobacco Use  . Smoking status: Former Games developer  . Smokeless tobacco: Never Used  Substance Use Topics  . Alcohol use: Yes    Alcohol/week: 0.0 standard drinks      Objective:   There were no vitals taken for this visit. There were no vitals filed for this visit.   Physical Exam      Assessment & Plan    1. Anxiety and depression  We will add Lexapro in addition to Buspar, titrate up to her former dose as below. She will see her counselor this month and we will plan to follow up in one month.  - escitalopram (LEXAPRO) 20 MG tablet; Take 1 tablet (20 mg total) by mouth daily. Take 10 mg daily for two weeks. Then take 20 mg daily onward.  Dispense: 90 tablet; Refill: 0  The entirety of the information documented in the History of Present Illness, Review of Systems and Physical Exam were personally obtained by me. Portions of this information were initially documented by Rondel Baton, CMA and reviewed by me for thoroughness and accuracy.       [name], PA-C  [facility]

## 2019-05-16 ENCOUNTER — Ambulatory Visit (INDEPENDENT_AMBULATORY_CARE_PROVIDER_SITE_OTHER): Payer: Self-pay | Admitting: Physician Assistant

## 2019-05-16 ENCOUNTER — Other Ambulatory Visit: Payer: Self-pay

## 2019-05-16 ENCOUNTER — Telehealth: Payer: Self-pay

## 2019-05-16 DIAGNOSIS — F32A Depression, unspecified: Secondary | ICD-10-CM

## 2019-05-16 DIAGNOSIS — F329 Major depressive disorder, single episode, unspecified: Secondary | ICD-10-CM

## 2019-05-16 DIAGNOSIS — J0110 Acute frontal sinusitis, unspecified: Secondary | ICD-10-CM

## 2019-05-16 DIAGNOSIS — B359 Dermatophytosis, unspecified: Secondary | ICD-10-CM

## 2019-05-16 DIAGNOSIS — F419 Anxiety disorder, unspecified: Secondary | ICD-10-CM

## 2019-05-16 NOTE — Telephone Encounter (Signed)
Patient states that she was seen in office  and states that antibiotic was suppose to be called to her pharmacy along with another prescription that was prescribed today. Please advise, [facility] [address] KW

## 2019-05-17 MED ORDER — AMOXICILLIN-POT CLAVULANATE 875-125 MG PO TABS: 1.0000 | ORAL_TABLET | Freq: Two times a day (BID) | ORAL | 0 refills | Status: AC

## 2019-05-17 MED ORDER — CLOTRIMAZOLE 1 % EX CREA: 1.0000 "application " | TOPICAL_CREAM | Freq: Two times a day (BID) | CUTANEOUS | 0 refills | Status: DC

## 2019-05-17 NOTE — Progress Notes (Signed)
Subjective:    Patient ID: Brandy Becker, female    DOB: 09/28/1991, 28 y.o.   MRN: [id number]  Brandy Becker is a 28 y.o. female presenting on 05/16/2019 for No chief complaint on file.   HPI   Last visit had lexapro 20 mg added back to buspar 7.5 mg BID. She feels she is doing well with this and does not want to make any medication adjustments.   She has a rash on her arm that started out looking like a pimple and then got much bigger. It is itchy.   She has had sinus pain and pressure ongoing for 4+ weeks and is worsening.   Social History   Tobacco Use  . Smoking status: Former Research scientist (life sciences)  . Smokeless tobacco: Never Used  Substance Use Topics  . Alcohol use: Yes    Alcohol/week: 0.0 standard drinks  . Drug use: No    Review of Systems Per HPI unless specifically indicated above     Objective:    There were no vitals taken for this visit.  Wt Readings from Last 3 Encounters:  02/22/19 245 lb 3.2 oz (111.2 kg)  12/03/18 241 lb (109.3 kg)  11/22/18 232 lb 12.8 oz (105.6 kg)    Physical Exam Constitutional:      Appearance: Normal appearance.  HENT:     Right Ear: Tympanic membrane and ear canal normal.     Left Ear: Tympanic membrane and ear canal normal.  Cardiovascular:     Rate and Rhythm: Normal rate and regular rhythm.     Heart sounds: Normal heart sounds.  Pulmonary:     Effort: Pulmonary effort is normal.     Breath sounds: Normal breath sounds.  Musculoskeletal:       Arms:  Skin:    General: Skin is warm and dry.     Findings: Erythema present.  Neurological:     Mental Status: She is alert and oriented to person, place, and time. Mental status is at baseline.  Psychiatric:        Mood and Affect: Mood normal.        Behavior: Behavior normal.    Results for orders placed or performed in visit on 02/22/19  Iron, TIBC and Ferritin Panel  Result Value Ref Range   Total Iron Binding Capacity 305 250 - 450 ug/dL   UIBC 267 131 - 425 ug/dL   Iron 38 27 - 159 ug/dL   Iron Saturation 12 (L) 15 - 55 %   Ferritin 53 15 - 150 ng/mL  CBC with Differential/Platelet  Result Value Ref Range   WBC 9.4 3.4 - 10.8 x10E3/uL   RBC 4.25 3.77 - 5.28 x10E6/uL   Hemoglobin 12.0 11.1 - 15.9 g/dL   Hematocrit 36.8 34.0 - 46.6 %   MCV 87 79 - 97 fL   MCH 28.2 26.6 - 33.0 pg   MCHC 32.6 31.5 - 35.7 g/dL   RDW 12.5 11.7 - 15.4 %   Platelets 352 150 - 450 x10E3/uL   Neutrophils 55 Not Estab. %   Lymphs 35 Not Estab. %   Monocytes 6 Not Estab. %   Eos 2 Not Estab. %   Basos 1 Not Estab. %   Neutrophils Absolute 5.3 1.4 - 7.0 x10E3/uL   Lymphocytes Absolute 3.3 (H) 0.7 - 3.1 x10E3/uL   Monocytes Absolute 0.6 0.1 - 0.9 x10E3/uL   EOS (ABSOLUTE) 0.2 0.0 - 0.4 x10E3/uL   Basophils Absolute 0.1 0.0 - 0.2 x10E3/uL  Immature Granulocytes 1 Not Estab. %   Immature Grans (Abs) 0.1 0.0 - 0.1 x10E3/uL  Beta hCG quant (ref lab)  Result Value Ref Range   hCG Quant <1 mIU/mL  Pregnancy, urine  Result Value Ref Range   Preg Test, Ur Negative Negative      Assessment & Plan:  1. Acute non-recurrent frontal sinusitis  - amoxicillin-clavulanate (AUGMENTIN) 875-125 MG tablet; Take 1 tablet by mouth 2 (two) times daily for 10 days.  Dispense: 20 tablet; Refill: 0  2. Ringworm  - clotrimazole (CLOTRIMAZOLE ANTI-FUNGAL) 1 % cream; Apply 1 application topically 2 (two) times daily.  Dispense: 30 g; Refill: 0  3. Anxiety and Depression  Stable, continue medications. Follow up in 6 months to 1 year.   The entirety of the information documented in the History of Present Illness, Review of Systems and Physical Exam were personally obtained by me. Portions of this information were initially documented by Kavin LeechLaura Walsh, CMA and reviewed by me for thoroughness and accuracy.    Osvaldo AngstAdriana Pollak, PA-C [facility] 05/17/2019, 5:03 PM

## 2019-05-17 NOTE — Telephone Encounter (Signed)
[facility] [address]

## 2019-05-17 NOTE — Patient Instructions (Signed)

## 2019-06-20 ENCOUNTER — Other Ambulatory Visit: Payer: Self-pay

## 2019-06-20 DIAGNOSIS — Z20822 Contact with and (suspected) exposure to covid-19: Secondary | ICD-10-CM

## 2019-06-20 NOTE — Progress Notes (Signed)
la 

## 2019-06-21 LAB — NOVEL CORONAVIRUS, NAA: SARS-CoV-2, NAA: NOT DETECTED

## 2019-06-21 LAB — SPECIMEN STATUS REPORT

## 2019-06-30 ENCOUNTER — Other Ambulatory Visit: Payer: Self-pay | Admitting: Physician Assistant

## 2019-06-30 DIAGNOSIS — B359 Dermatophytosis, unspecified: Secondary | ICD-10-CM

## 2019-08-21 ENCOUNTER — Other Ambulatory Visit: Payer: Self-pay

## 2019-08-21 DIAGNOSIS — Z20822 Contact with and (suspected) exposure to covid-19: Secondary | ICD-10-CM

## 2019-08-22 LAB — NOVEL CORONAVIRUS, NAA: SARS-CoV-2, NAA: NOT DETECTED

## 2020-02-16 ENCOUNTER — Other Ambulatory Visit: Payer: Self-pay | Admitting: Physician Assistant

## 2020-02-16 DIAGNOSIS — F329 Major depressive disorder, single episode, unspecified: Secondary | ICD-10-CM

## 2020-02-16 DIAGNOSIS — F419 Anxiety disorder, unspecified: Secondary | ICD-10-CM

## 2020-02-16 DIAGNOSIS — F32A Depression, unspecified: Secondary | ICD-10-CM

## 2020-02-16 NOTE — Telephone Encounter (Signed)
Requested medications are due for refill today?  Unknown - Historical medication  Requested medications are on active medication list?  Yes as a historical medication  Last Refill:  Unknown  Future visit scheduled?  No  Notes to Clinic:  Medication Refill request failed protocol due to being historical medication and no valic encounter in the past 6 months.  Patient last seen 9 months ago.

## 2020-02-18 NOTE — Telephone Encounter (Signed)
Called patient and no answer left voice message to advise patient. If patient calls back ok for PEC to advise patient and schedule visit.

## 2020-02-18 NOTE — Telephone Encounter (Signed)
Please let patient know I refilled Buspar x 2 months. She is due for a yearly follow up in June, can we please schedule? She is also due for her PAP smear at that time as well. She can schedule that with her OBGYN or I can do it for her here at her visit.

## 2020-02-26 ENCOUNTER — Ambulatory Visit (INDEPENDENT_AMBULATORY_CARE_PROVIDER_SITE_OTHER): Payer: BLUE CROSS/BLUE SHIELD | Admitting: Physician Assistant

## 2020-02-26 DIAGNOSIS — J0110 Acute frontal sinusitis, unspecified: Secondary | ICD-10-CM

## 2020-02-26 MED ORDER — AMOXICILLIN-POT CLAVULANATE 875-125 MG PO TABS: 1.0000 | ORAL_TABLET | Freq: Two times a day (BID) | ORAL | 0 refills | Status: AC

## 2020-02-26 NOTE — Progress Notes (Signed)
Virtual telephone visit    Virtual Visit via Telephone Note   This visit type was conducted due to national recommendations for restrictions regarding the COVID-19 Pandemic (e.g. social distancing) in an effort to limit this patient's exposure and mitigate transmission in our community. Due to her co-morbid illnesses, this patient is at least at moderate risk for complications without adequate follow up. This format is felt to be most appropriate for this patient at this time. The patient did not have access to video technology or had technical difficulties with video requiring transitioning to audio format only (telephone). Physical exam was limited to content and character of the telephone converstion.    Patient location: Home Provider location: Office   Patient: Brandy Becker   DOB: 07/07/1991   29 y.o. Female  MRN: [id number] Visit Date: 02/26/2020  Today's Provider: [name], PA-C  Subjective:    Brandy Becker,acting as a scribe for [name], PA-C.,have documented all relevant documentation on the behalf of [name], PA-C,as directed by  [name], PA-C while in the presence of [name], PA-C.  Chief Complaint  Patient presents with  . Sinus Problem   Sinus Problem This is a recurrent problem. The current episode started in the past 7 days. The problem has been gradually worsening since onset. There has been no fever. The fever has been present for less than 1 day. Her pain is at a severity of 9/10. The pain is moderate. Associated symptoms include congestion, coughing, ear pain, headaches, shortness of breath, sinus pressure and a sore throat. Pertinent negatives include no chills, hoarse voice or sneezing. Past treatments include nothing. The treatment provided no relief.  Last COVID vaccine on 02/17/2020. She reports she is taking her allergy medicine and flonase. She has been sick for a total of two weeks and this has been worsening  over the past 4 days. No SOB.       Medications: Outpatient Medications Prior to Visit  Medication Sig  . busPIRone (BUSPAR) 7.5 MG tablet TAKE 1 TABLET (7.5 MG TOTAL) BY MOUTH 2 (TWO) TIMES DAILY FOR 30 DAYS.  Marland Kitchen clotrimazole (LOTRIMIN) 1 % cream APPLY TO AFFECTED AREA TWICE A DAY  . escitalopram (LEXAPRO) 20 MG tablet Take 1 tablet (20 mg total) by mouth daily. Take 10 mg daily for two weeks. Then take 20 mg daily onward.  . ethynodiol-ethinyl estradiol [place] 1/35) 1-35 MG-MCG tablet Take 1 tablet by mouth daily. (Patient not taking: Reported on 02/26/2020)  . hydrOXYzine (ATARAX/VISTARIL) 25 MG tablet TAKE 1 TABLET (25 MG TOTAL) BY MOUTH DAILY AS NEEDED. (Patient not taking: Reported on 02/26/2020)  . vitamin C (ASCORBIC ACID) 250 MG tablet Take 250 mg by mouth daily.   No facility-administered medications prior to visit.    Review of Systems  Constitutional: Negative for chills, fatigue and fever.  HENT: Positive for congestion, ear pain, sinus pressure and sore throat. Negative for hoarse voice and sneezing.   Respiratory: Positive for cough and shortness of breath.   Neurological: Positive for dizziness and headaches. Negative for weakness.        Objective:    There were no vitals taken for this visit.       Assessment & Plan:    1. Acute non-recurrent frontal sinusitis  - symptoms and exam c/w sinusitis   - no evidence of AOM, CAP, strep pharyngitis, or other infection - given duration of symptoms, suspect bacterial etiology - will treat  with Augmentin 875-125 mg x7d - discussed symptomatic management (flonase, decongestants, etc), natural course, and return precautions  -did suggest COVID testing however with symptoms for 2+ weeks, any initial infection will likely have resolved  - amoxicillin-clavulanate (AUGMENTIN) 875-125 MG tablet; Take 1 tablet by mouth 2 (two) times daily for 7 days.  Dispense: 14 tablet; Refill: 0  I, Trinna Post, PA-C, have reviewed  all documentation for this visit. The documentation on 02/26/20 for the exam, diagnosis, procedures, and orders are all accurate and complete.     I discussed the assessment and treatment plan with the patient. The patient was provided an opportunity to ask questions and all were answered. The patient agreed with the plan and demonstrated an understanding of the instructions.   The patient was advised to call back or seek an in-person evaluation if the symptoms worsen or if the condition fails to improve as anticipated.  I provided 15 minutes of non-face-to-face time during this encounter.  ITrinna Post, PA-C, have reviewed all documentation for this visit. The documentation on 02/26/20 for the exam, diagnosis, procedures, and orders are all accurate and complete.   Paulene Floor [facility] [phone] (phone) [phone] (fax)  [place]

## 2020-04-24 ENCOUNTER — Other Ambulatory Visit: Payer: Self-pay | Admitting: Physician Assistant

## 2020-04-24 DIAGNOSIS — F419 Anxiety disorder, unspecified: Secondary | ICD-10-CM

## 2020-04-24 DIAGNOSIS — F32A Depression, unspecified: Secondary | ICD-10-CM

## 2020-04-27 ENCOUNTER — Encounter: Payer: Medicaid Other | Admitting: Physician Assistant

## 2020-05-08 ENCOUNTER — Other Ambulatory Visit: Payer: Self-pay | Admitting: Physician Assistant

## 2020-05-08 DIAGNOSIS — F32A Depression, unspecified: Secondary | ICD-10-CM

## 2020-05-08 DIAGNOSIS — F419 Anxiety disorder, unspecified: Secondary | ICD-10-CM

## 2020-05-08 NOTE — Telephone Encounter (Signed)
° °  Future visit scheduled: no  Notes to clinic:  REQUEST FOR 90 DAYS PRESCRIPTION Patient canceled last appointment   Requested Prescriptions  Pending Prescriptions Disp Refills   busPIRone (BUSPAR) 7.5 MG tablet [Pharmacy Med Name: BUSPIRONE HCL 7.5 MG TABLET] 180 tablet 1    Sig: TAKE 1 TABLET (7.5 MG TOTAL) BY MOUTH 2 (TWO) TIMES DAILY FOR 30 DAYS.      Psychiatry: Anxiolytics/Hypnotics - Non-controlled Failed - 05/08/2020  1:19 PM      Failed - Valid encounter within last 6 months    Recent Outpatient Visits           2 months ago Acute non-recurrent frontal sinusitis   [facility] Osvaldo Angst [place], [place]   11 months ago Acute non-recurrent frontal sinusitis   [facility] [place], Lavella [place], [place]   1 year ago Anxiety and depression   [facility] [place], Lavella Hammock, [place]   1 year ago Vaginal bleeding   [facility] [name], [place]   1 year ago Anxiety and depression   [facility] Osvaldo Angst [place], [place]

## 2020-05-25 ENCOUNTER — Other Ambulatory Visit: Payer: Self-pay | Admitting: Physician Assistant

## 2020-05-25 DIAGNOSIS — F32A Depression, unspecified: Secondary | ICD-10-CM

## 2020-05-25 NOTE — Telephone Encounter (Signed)
Requested medication (s) are due for refill today: no  Requested medication (s) are on the active medication list: yes    Future visit scheduled: no  Notes to clinic:  REQUEST FOR 90 DAYS PRESCRIPTION   Requested Prescriptions  Pending Prescriptions Disp Refills   busPIRone (BUSPAR) 7.5 MG tablet [Pharmacy Med Name: BUSPIRONE HCL 7.5 MG TABLET] 180 tablet 1    Sig: TAKE 1 TABLET (7.5 MG TOTAL) BY MOUTH 2 (TWO) TIMES DAILY FOR 30 DAYS.      Psychiatry: Anxiolytics/Hypnotics - Non-controlled Failed - 05/25/2020 10:53 AM      Failed - Valid encounter within last 6 months    Recent Outpatient Visits           2 months ago Acute non-recurrent frontal sinusitis   [facility] [place], Ricki Rodriguez M, [place]   1 year ago Acute non-recurrent frontal sinusitis   [facility] [place], Lavella Hammock, [place]   1 year ago Anxiety and depression   [facility] [place], Lavella [place], [place]   1 year ago Vaginal bleeding   [facility] [name], [place]   1 year ago Anxiety and depression   [facility] Osvaldo Angst [place], [place]

## 2020-05-26 NOTE — Telephone Encounter (Signed)
L.O.V. was on 02/26/2020 and no upcoming appointment. Patient canceled appointment that was scheduled on 04/27/2020. Patient is requesting a 90 supply of medication. Please advise.

## 2020-05-27 NOTE — Telephone Encounter (Signed)
Needs appt scheduled, not seen for this > 1 year.

## 2020-06-01 NOTE — Telephone Encounter (Signed)
Tried calling patient to schedule a visit and no answer or voicemail set. Send patient a message via MyChart advising her to call the office to schedule appointment.

## 2020-06-10 ENCOUNTER — Other Ambulatory Visit: Payer: Self-pay | Admitting: Physician Assistant

## 2020-06-10 DIAGNOSIS — F32A Depression, unspecified: Secondary | ICD-10-CM

## 2020-06-10 DIAGNOSIS — F419 Anxiety disorder, unspecified: Secondary | ICD-10-CM

## 2020-10-19 ENCOUNTER — Other Ambulatory Visit: Payer: Self-pay | Admitting: Physician Assistant

## 2020-10-19 DIAGNOSIS — F32A Depression, unspecified: Secondary | ICD-10-CM

## 2021-07-07 ENCOUNTER — Other Ambulatory Visit: Payer: Self-pay | Admitting: Obstetrics and Gynecology

## 2021-07-07 ENCOUNTER — Encounter: Payer: Self-pay | Admitting: Obstetrics and Gynecology

## 2021-07-07 ENCOUNTER — Ambulatory Visit (INDEPENDENT_AMBULATORY_CARE_PROVIDER_SITE_OTHER): Payer: Self-pay | Admitting: Obstetrics and Gynecology

## 2021-07-07 ENCOUNTER — Other Ambulatory Visit: Payer: Self-pay

## 2021-07-07 VITALS — BP 119/74 | HR 80 | Ht 65.0 in | Wt 257.4 lb

## 2021-07-07 DIAGNOSIS — O34219 Maternal care for unspecified type scar from previous cesarean delivery: Secondary | ICD-10-CM

## 2021-07-07 DIAGNOSIS — Z7689 Persons encountering health services in other specified circumstances: Secondary | ICD-10-CM

## 2021-07-07 DIAGNOSIS — Z3200 Encounter for pregnancy test, result unknown: Secondary | ICD-10-CM

## 2021-07-07 DIAGNOSIS — Z6841 Body Mass Index (BMI) 40.0 and over, adult: Secondary | ICD-10-CM

## 2021-07-07 MED ORDER — DOXYLAMINE-PYRIDOXINE 10-10 MG PO TBEC: 10.0000 mg | DELAYED_RELEASE_TABLET | Freq: Every day | ORAL | 1 refills | Status: DC

## 2021-07-07 NOTE — Progress Notes (Signed)
HPI:      Brandy Becker is a 30 y.o. [id number] who LMP was Patient's last menstrual period was 05/13/2021 (approximate).  Subjective:   She presents today for pregnancy confirmation.  She has missed a menstrual period and that makes her approximately 7 weeks estimated gestational age.  She complains of some pelvic cramping but denies bleeding.  She has previously had a miscarriage.  She was not attempting pregnancy but "not preventing pregnancy".  She is currently taking prenatal vitamins.  She was on antidepressant medications and has discontinued them but says that she is so far doing well without them. (Baseline PHQ-9 done today) she had postpartum depression after her last birth. Her last pregnancy was complicated by a birth at approximately 35 weeks by cesarean delivery.  It is listed as failure to progress.  Patient is unsure whether she wants to have a cesarean or TOLAC.  She has some nausea without vomiting. She would like to breast-feed this baby if possible.    Hx: The following portions of the patient's history were reviewed and updated as appropriate:             She  has a past medical history of ADHD (attention deficit hyperactivity disorder), Anxiety, Depression, Environmental allergies, GERD (gastroesophageal reflux disease), History of chlamydia, History of multiple miscarriages, herpes genitalis, and Vaginal spotting. She does not have any pertinent problems on file. She  has a past surgical history that includes Cesarean section (2009); Tonsillectomy; and Dilation and evacuation (N/A, 07/08/2015). Her family history includes Cancer in her maternal grandmother; Esophageal cancer in her maternal grandfather; HIV in her cousin; Hyperlipidemia in her mother; Hypertension in her mother; Lung cancer in an other family member; Multiple sclerosis in her brother; Seizures in her son. She  reports that she has quit smoking. She has never used smokeless tobacco. She reports that she does  not currently use alcohol. She reports that she does not use drugs. She has a current medication list which includes the following prescription(s): prenatal vit-dss-fe cbn-fa, buspirone, and escitalopram. She is allergic to oxycodone.       Review of Systems:  Review of Systems  Constitutional: Denied constitutional symptoms, night sweats, recent illness, fatigue, fever, insomnia and weight loss.  Eyes: Denied eye symptoms, eye pain, photophobia, vision change and visual disturbance.  Ears/Nose/Throat/Neck: Denied ear, nose, throat or neck symptoms, hearing loss, nasal discharge, sinus congestion and sore throat.  Cardiovascular: Denied cardiovascular symptoms, arrhythmia, chest pain/pressure, edema, exercise intolerance, orthopnea and palpitations.  Respiratory: Denied pulmonary symptoms, asthma, pleuritic pain, productive sputum, cough, dyspnea and wheezing.  Gastrointestinal: Denied, gastro-esophageal reflux, melena, nausea and vomiting.  Genitourinary: Denied genitourinary symptoms including symptomatic vaginal discharge, pelvic relaxation issues, and urinary complaints.  Musculoskeletal: Denied musculoskeletal symptoms, stiffness, swelling, muscle weakness and myalgia.  Dermatologic: Denied dermatology symptoms, rash and scar.  Neurologic: Denied neurology symptoms, dizziness, headache, neck pain and syncope.  Psychiatric: Denied psychiatric symptoms, anxiety and depression.  Endocrine: Denied endocrine symptoms including hot flashes and night sweats.   Meds:   Current Outpatient Medications on File Prior to Visit  Medication Sig Dispense Refill   Prenatal Vit-DSS-Fe Cbn-FA (PRENATAL AD PO) Take by mouth.     busPIRone (BUSPAR) 7.5 MG tablet TAKE 1 TABLET (7.5 MG TOTAL) BY MOUTH 2 (TWO) TIMES DAILY FOR 30 DAYS. (Patient not taking: Reported on 07/07/2021) 60 tablet 0   escitalopram (LEXAPRO) 20 MG tablet Take 1 tablet (20 mg total) by mouth daily. Take 10 mg daily for  two weeks. Then  take 20 mg daily onward. 90 tablet 0   No current facility-administered medications on file prior to visit.      Objective:     Vitals:   07/07/21 0740  BP: 119/74  Pulse: 80   Filed Weights   07/07/21 0740  Weight: 257 lb 6.4 oz (116.8 kg)              Urinary beta-hCG positive          Assessment:    [id number] Patient Active Problem List   Diagnosis Date Noted   Anxiety and depression 04/10/2018   Vaginal spotting    History of multiple miscarriages    GERD (gastroesophageal reflux disease)    Hx of herpes genitalis    History of chlamydia      1. Possible pregnancy, not confirmed   2. Encounter to establish care   3. Morbid obesity with BMI of 40.0-44.9, adult (HCC)   4. History of cesarean delivery, currently pregnant        Plan:            Prenatal Plan 1.  The patient was given prenatal literature. 2.  She was continued on prenatal vitamins. 3.  A prenatal lab panel to be drawn at nurse visit. 4.  An ultrasound was ordered to better determine an EDC. 5.  A nurse visit was scheduled. 6.  Genetic testing and testing for other inheritable conditions discussed in detail. She will decide in the future whether to have these labs performed. 7.  A general overview of pregnancy testing, visit schedule, ultrasound schedule, and prenatal care was discussed. 8.  COVID and its risks associated with pregnancy, prevention by limiting exposure and use of masks, as well as the risks and benefits of vaccination during pregnancy were discussed in detail.  [facility] policy regarding office and hospital visitation and testing was explained. 9.  Benefits of breast-feeding discussed in detail including both maternal and infant benefits. Ready Set Baby website discussed. 10.  Follow BMI-possible anesthesia consult necessary in third trimester.  Orders No orders of the defined types were placed in this encounter.   No orders of the defined types were placed in this encounter.      F/U  Return in about 5 weeks (around 08/11/2021). I spent 32 minutes involved in the care of this patient preparing to see the patient by obtaining and reviewing her medical history (including labs, imaging tests and prior procedures), documenting clinical information in the electronic health record (EHR), counseling and coordinating care plans, writing and sending prescriptions, ordering tests or procedures and in direct communicating with the patient and medical staff discussing pertinent items from her history and physical exam.  Elonda Husky, M.D. 07/07/2021 8:27 AM

## 2021-07-08 ENCOUNTER — Other Ambulatory Visit: Payer: Self-pay | Admitting: Obstetrics and Gynecology

## 2021-07-08 DIAGNOSIS — Z3200 Encounter for pregnancy test, result unknown: Secondary | ICD-10-CM

## 2021-07-22 ENCOUNTER — Ambulatory Visit (INDEPENDENT_AMBULATORY_CARE_PROVIDER_SITE_OTHER): Payer: 59 | Admitting: Obstetrics and Gynecology

## 2021-07-22 ENCOUNTER — Other Ambulatory Visit: Payer: Medicaid Other

## 2021-07-22 ENCOUNTER — Other Ambulatory Visit: Payer: Self-pay

## 2021-07-22 VITALS — BP 132/80 | HR 78 | Resp 16 | Ht 65.0 in | Wt 255.7 lb

## 2021-07-22 DIAGNOSIS — Z3A10 10 weeks gestation of pregnancy: Secondary | ICD-10-CM

## 2021-07-22 DIAGNOSIS — Z3401 Encounter for supervision of normal first pregnancy, first trimester: Secondary | ICD-10-CM

## 2021-07-22 DIAGNOSIS — Z113 Encounter for screening for infections with a predominantly sexual mode of transmission: Secondary | ICD-10-CM | POA: Diagnosis not present

## 2021-07-22 DIAGNOSIS — O99210 Obesity complicating pregnancy, unspecified trimester: Secondary | ICD-10-CM | POA: Diagnosis not present

## 2021-07-22 NOTE — Patient Instructions (Signed)
First Trimester of Pregnancy The first trimester of pregnancy starts on the first day of your last menstrual period until the end of week 12. This is also called months 1 through 3 of pregnancy. Body changes during your first trimester Your body goes through many changes during pregnancy. The changes usually return to normal after your baby is born. Physical changes You may gain or lose weight. Your breasts may grow larger and hurt. The area around your nipples may get darker. Dark spots or blotches may develop on your face. You may have changes in your hair. Health changes You may feel like you might vomit (nauseous), and you may vomit. You may have heartburn. You may have headaches. You may have trouble pooping (constipation). Your gums may bleed. Other changes You may get tired easily. You may pee (urinate) more often. Your menstrual periods will stop. You may not feel hungry. You may want to eat certain kinds of food. You may have changes in your emotions from day to day. You may have more dreams. Follow these instructions at home: Medicines Take over-the-counter and prescription medicines only as told by your doctor. Some medicines are not safe during pregnancy. Take a prenatal vitamin that contains at least 600 micrograms (mcg) of folic acid. Eating and drinking Eat healthy meals that include: Fresh fruits and vegetables. Whole grains. Good sources of protein, such as meat, eggs, or tofu. Low-fat dairy products. Avoid raw meat and unpasteurized juice, milk, and cheese. If you feel like you may vomit, or you vomit: Eat 4 or 5 small meals a day instead of 3 large meals. Try eating a few soda crackers. Drink liquids between meals instead of during meals. You may need to take these actions to prevent or treat trouble pooping: Drink enough fluids to keep your pee (urine) pale yellow. Eat foods that are high in fiber. These include beans, whole grains, and fresh fruits and  vegetables. Limit foods that are high in fat and sugar. These include fried or sweet foods. Activity Exercise only as told by your doctor. Most people can do their usual exercise routine during pregnancy. Stop exercising if you have cramps or pain in your lower belly (abdomen) or low back. Do not exercise if it is too hot or too humid, or if you are in a place of great height (high altitude). Avoid heavy lifting. If you choose to, you may have sex unless your doctor tells you not to. Relieving pain and discomfort Wear a good support bra if your breasts are sore. Rest with your legs raised (elevated) if you have leg cramps or low back pain. If you have bulging veins (varicose veins) in your legs: Wear support hose as told by your doctor. Raise your feet for 15 minutes, 3-4 times a day. Limit salt in your food. Safety Wear your seat belt at all times when you are in a car. Talk with your doctor if someone is hurting you or yelling at you. Talk with your doctor if you are feeling sad or have thoughts of hurting yourself. Lifestyle Do not use hot tubs, steam rooms, or saunas. Do not douche. Do not use tampons or scented sanitary pads. Do not use herbal medicines, illegal drugs, or medicines that are not approved by your doctor. Do not drink alcohol. Do not smoke or use any products that contain nicotine or tobacco. If you need help quitting, ask your doctor. Avoid cat litter boxes and soil that is used by cats. These carry germs  that can cause harm to the baby and can cause a loss of your baby by miscarriage or stillbirth. General instructions Keep all follow-up visits. This is important. Ask for help if you need counseling or if you need help with nutrition. Your doctor can give you advice or tell you where to go for help. Visit your dentist. At home, brush your teeth with a soft toothbrush. Floss gently. Write down your questions. Take them to your prenatal visits. Where to find more  information American Pregnancy Association: americanpregnancy.org [employer]: www.acog.org Office on Women's Health: KeywordPortfolios.com.br Contact a doctor if: You are dizzy. You have a fever. You have mild cramps or pressure in your lower belly. You have a nagging pain in your belly area. You continue to feel like you may vomit, you vomit, or you have watery poop (diarrhea) for 24 hours or longer. You have a bad-smelling fluid coming from your vagina. You have pain when you pee. You are exposed to a disease that spreads from person to person, such as chickenpox, measles, Zika virus, HIV, or hepatitis. Get help right away if: You have spotting or bleeding from your vagina. You have very bad belly cramping or pain. You have shortness of breath or chest pain. You have any kind of injury, such as from a fall or a car crash. You have new or increased pain, swelling, or redness in an arm or leg. Summary The first trimester of pregnancy starts on the first day of your last menstrual period until the end of week 12 (months 1 through 3). Eat 4 or 5 small meals a day instead of 3 large meals. Do not smoke or use any products that contain nicotine or tobacco. If you need help quitting, ask your doctor. Keep all follow-up visits. This information is not intended to replace advice given to you by your health care provider. Make sure you discuss any questions you have with your health care provider. Document Revised: 04/01/2020 Document Reviewed: 02/06/2020 Elsevier Patient Education  [place]. Morning Sickness Morning sickness is when you feel like you may vomit (feel nauseous) during pregnancy. Sometimes, you may vomit. Morning sickness most often happens in the morning, but it can also happen at any time of the day. Some women may have morning sickness that makes them vomit all the time. This is a more serious problem that needs  treatment. What are the causes? The cause of this condition is not known. What increases the risk? You had vomiting or a feeling like you may vomit before your pregnancy. You had morning sickness in another pregnancy. You are pregnant with more than one baby, such as twins. What are the signs or symptoms? Feeling like you may vomit. Vomiting. How is this treated? Treatment is usually not needed for this condition. You may only need to change what you eat. In some cases, your doctor may give you some things to take for your condition. These include: Vitamin B6 supplements. Medicines to treat the feeling that you may vomit. Ginger. Follow these instructions at home: Medicines Take over-the-counter and prescription medicines only as told by your doctor. Do not take any medicines until you talk with your doctor about them first. Take multivitamins before you get pregnant. These can stop or lessen the symptoms of morning sickness. Eating and drinking Eat dry toast or crackers before getting out of bed. Eat 5 or 6 small meals a day. Eat dry and bland foods like rice and  baked potatoes. Do not eat greasy, fatty, or spicy foods. Have someone cook for you if the smell of food causes you to vomit or to feel like you may vomit. If you feel like you may vomit after taking prenatal vitamins, take them at night or with a snack. Eat protein foods when you need a snack. Nuts, yogurt, and cheese are good choices. Drink fluids throughout the day. Try ginger ale made with real ginger, ginger tea made from fresh grated ginger, or ginger candies. General instructions Do not smoke or use any products that contain nicotine or tobacco. If you need help quitting, ask your doctor. Use an air purifier to keep the air in your house free of smells. Get lots of fresh air. Try to avoid smells that make you feel sick. Try wearing an acupressure wristband. This is a wristband that is used to treat seasickness. Try  a treatment called acupuncture. In this treatment, a doctor puts needles into certain areas of your body to make you feel better. Contact a doctor if: You need medicine to feel better. You feel dizzy or light-headed. You are losing weight. Get help right away if: The feeling that you may vomit will not go away, or you cannot stop vomiting. You faint. You have very bad pain in your belly. Summary Morning sickness is when you feel like you may vomit (feel nauseous) during pregnancy. You may feel sick in the morning, but you can feel this way at any time of the day. Making some changes to what you eat may help your symptoms go away. This information is not intended to replace advice given to you by your health care provider. Make sure you discuss any questions you have with your health care provider. Document Revised: 06/08/2020 Document Reviewed: 05/18/2020 Elsevier Patient Education  2022 Elsevier Inc. Commonly Asked Questions During Pregnancy  Cats: A parasite can be excreted in cat feces.  To avoid exposure you need to have another person empty the little box.  If you must empty the litter box you will need to wear gloves.  Wash your hands after handling your cat.  This parasite can also be found in raw or undercooked meat so this should also be avoided.  Colds, Sore Throats, Flu: Please check your medication sheet to see what you can take for symptoms.  If your symptoms are unrelieved by these medications please call the office.  Dental Work: Most any dental work Agricultural consultant recommends is permitted.  X-rays should only be taken during the first trimester if absolutely necessary.  Your abdomen should be shielded with a lead apron during all x-rays.  Please notify your provider prior to receiving any x-rays.  Novocaine is fine; gas is not recommended.  If your dentist requires a note from [place] prior to dental work please call the office and we will provide one for you.  Exercise: Exercise is  an important part of staying healthy during your pregnancy.  You may continue most exercises you were accustomed to prior to pregnancy.  Later in your pregnancy you will most likely notice you have difficulty with activities requiring balance like riding a bicycle.  It is important that you listen to your body and avoid activities that put you at a higher risk of falling.  Adequate rest and staying well hydrated are a must!  If you have questions about the safety of specific activities ask your provider.    Exposure to Children with illness: Try to avoid obvious  exposure; report any symptoms to [place] when noted,  If you have chicken pos, red measles or mumps, you should be immune to these diseases.   Please do not take any vaccines while pregnant unless you have checked with your OB provider.  Fetal Movement: After 28 weeks we recommend you do "kick counts" twice daily.  Lie or sit down in a calm quiet environment and count your baby movements "kicks".  You should feel your baby at least 10 times per hour.  If you have not felt 10 kicks within the first hour get up, walk around and have something sweet to eat or drink then repeat for an additional hour.  If count remains less than 10 per hour notify your provider.  Fumigating: Follow your pest control agent's advice as to how long to stay out of your home.  Ventilate the area well before re-entering.  Hemorrhoids:   Most over-the-counter preparations can be used during pregnancy.  Check your medication to see what is safe to use.  It is important to use a stool softener or fiber in your diet and to drink lots of liquids.  If hemorrhoids seem to be getting worse please call the office.   Hot Tubs:  Hot tubs Jacuzzis and saunas are not recommended while pregnant.  These increase your internal body temperature and should be avoided.  Intercourse:  Sexual intercourse is safe during pregnancy as long as you are comfortable, unless otherwise advised by your  provider.  Spotting may occur after intercourse; report any bright red bleeding that is heavier than spotting.  Labor:  If you know that you are in labor, please go to the hospital.  If you are unsure, please call the office and let us help you decide what to do.  Lifting, straining, etc:  If your job requires heavy lifting or straining please check with your provider for any limitations.  Generally, you should not lift items heavier than that you can lift simply with your hands and arms (no back muscles)  Painting:  Paint fumes do not harm your pregnancy, but may make you ill and should be avoided if possible.  Latex or water based paints have less odor than oils.  Use adequate ventilation while painting.  Permanents & Hair Color:  Chemicals in hair dyes are not recommended as they cause increase hair dryness which can increase hair loss during pregnancy.  " Highlighting" and permanents are allowed.  Dye may be absorbed differently and permanents may not hold as well during pregnancy.  Sunbathing:  Use a sunscreen, as skin burns easily during pregnancy.  Drink plenty of fluids; avoid over heating.  Tanning Beds:  Because their possible side effects are still unknown, tanning beds are not recommended.  Ultrasound Scans:  Routine ultrasounds are performed at approximately 20 weeks.  You will be able to see your baby's general anatomy an if you would like to know the gender this can usually be determined as well.  If it is questionable when you conceived you may also receive an ultrasound early in your pregnancy for dating purposes.  Otherwise ultrasound exams are not routinely performed unless there is a medical necessity.  Although you can request a scan we ask that you pay for it when conducted because insurance does not cover " patient request" scans.  Work: If your pregnancy proceeds without complications you may work until your due date, unless your physician or employer advises  otherwise.  Round Ligament Pain/Pelvic Discomfort:  Hervey Ard,  shooting pains not associated with bleeding are fairly common, usually occurring in the second trimester of pregnancy.  They tend to be worse when standing up or when you remain standing for long periods of time.  These are the result of pressure of certain pelvic ligaments called "round ligaments".  Rest, Tylenol and heat seem to be the most effective relief.  As the womb and fetus grow, they rise out of the pelvis and the discomfort improves.  Please notify the office if your pain seems different than that described.  It may represent a more serious condition.  [facility] Pediatrician List  [facility]  [address] [place], [place], [place] [address]  Phone: [phone]  [facility] (second location)  [address] [place], [place] [address]  Phone: [phone]  [facility]) [address] [place], [place], [place] [address] Phone: [phone]  [facility]  [address], [place], [place] [address]  Phone: [phone] Class  April 19, 2017  Wednesday 7:00p - 9:00p  [facility] [place], [place]  May 24, 2017  Wednesday 7:00p - 9:00p [facility] [place], [place]    June 28, 2017   Wednesday 7:00p - 9:000p [facility] [place], [place]  July 26, 2017  Wednesday  7:00p - 9:00p [facility] [place], [place]  August 23, 2017 Wednesday 7:00p - 9:00p [facility] [place], [place]  Interested in a waterbirth?  This informational class will help you discover whether waterbirth is the right fit for you.  Education about waterbirth itself, supplies you would need and how to assemble your support team is what you can expect from this class.  Some obstetrical practices require this class in order to pursue a waterbirth.  (Not all obstetrical practices offer waterbirth check  with your healthcare provider)  Register only the expectant mom, but you are encouraged to bring your partner to class!  Fees & Payment No fee  Register Online [email or web address] Search [place]

## 2021-07-22 NOTE — Progress Notes (Signed)
Elizebeth Brooking presents for NOB nurse interview visit. Pregnancy confirmation done 07/07/2021  [id number]   . Pregnancy education material explained and given. No cats in the home. NOB labs ordered. TSH/HbgA1c due to Increased BMI. Body mass index is 42.55 kg/m. HIV labs and Drug screen were explained optional and she did not decline. Drug screen ordered. PNV encouraged. Genetic screening options discussed. Genetic testing: Ordered.  Pt may discuss with provider.  Financial policy n/a. FMLA form reviewed and signed. Pt. To follow up with provider in 3 weeks for NOB physical.  All questions answered. She has been having some mild cramping and constipation.

## 2021-07-24 LAB — URINALYSIS, ROUTINE W REFLEX MICROSCOPIC

## 2021-07-25 LAB — PAIN MGT SCRN (14 DRUGS), UR
Amphetamine Scrn, Ur: NEGATIVE ng/mL
BARBITURATE SCREEN URINE: NEGATIVE ng/mL
BENZODIAZEPINE SCREEN, URINE: NEGATIVE ng/mL
Buprenorphine, Urine: NEGATIVE ng/mL
CANNABINOIDS UR QL SCN: NEGATIVE ng/mL
Cocaine (Metab) Scrn, Ur: NEGATIVE ng/mL
Creatinine(Crt), U: 289.9 mg/dL (ref 20.0–300.0)
Fentanyl, Urine: NEGATIVE pg/mL
Meperidine Screen, Urine: NEGATIVE ng/mL
Methadone Screen, Urine: NEGATIVE ng/mL
OXYCODONE+OXYMORPHONE UR QL SCN: NEGATIVE ng/mL
Opiate Scrn, Ur: NEGATIVE ng/mL
Ph of Urine: 5.7 (ref 4.5–8.9)
Phencyclidine Qn, Ur: NEGATIVE ng/mL
Propoxyphene Scrn, Ur: NEGATIVE ng/mL
Tramadol Screen, Urine: NEGATIVE ng/mL

## 2021-07-25 LAB — URINE CULTURE, OB REFLEX

## 2021-07-25 LAB — CULTURE, OB URINE

## 2021-07-25 LAB — NICOTINE SCREEN, URINE: Cotinine Ql Scrn, Ur: NEGATIVE ng/mL

## 2021-07-26 LAB — RPR: RPR Ser Ql: NONREACTIVE

## 2021-07-26 LAB — HIV ANTIBODY (ROUTINE TESTING W REFLEX): HIV Screen 4th Generation wRfx: NONREACTIVE

## 2021-07-26 LAB — VIRAL HEPATITIS HBV, HCV
HCV Ab: <0.1 s/co ratio (ref 0.0–0.9)
Hep B Core Total Ab: NEGATIVE
Hep B Surface Ab, Qual: REACTIVE
Hepatitis B Surface Ag: NEGATIVE

## 2021-07-26 LAB — VARICELLA ZOSTER ANTIBODY, IGG: Varicella zoster IgG: 651 index (ref >165)

## 2021-07-26 LAB — RUBELLA SCREEN: Rubella Antibodies, IGG: 4.95 index (ref >0.99)

## 2021-07-26 LAB — TSH: TSH: 1.8 u[IU]/mL (ref 0.450–4.500)

## 2021-07-26 LAB — HCV INTERPRETATION

## 2021-07-26 LAB — PARVOVIRUS B19 ANTIBODY, IGG AND IGM
Parvovirus B19 IgG: 0.7 index (ref 0.0–0.8)
Parvovirus B19 IgM: 0.1 index (ref 0.0–0.8)

## 2021-07-28 ENCOUNTER — Ambulatory Visit (INDEPENDENT_AMBULATORY_CARE_PROVIDER_SITE_OTHER): Payer: 59

## 2021-07-28 ENCOUNTER — Other Ambulatory Visit: Payer: Self-pay

## 2021-07-28 DIAGNOSIS — Z3200 Encounter for pregnancy test, result unknown: Secondary | ICD-10-CM | POA: Diagnosis not present

## 2021-07-28 LAB — GC/CHLAMYDIA PROBE AMP
Chlamydia trachomatis, NAA: NEGATIVE
Neisseria Gonorrhoeae by PCR: NEGATIVE

## 2021-08-11 ENCOUNTER — Telehealth: Payer: Self-pay | Admitting: Obstetrics and Gynecology

## 2021-08-11 ENCOUNTER — Encounter: Payer: Self-pay | Admitting: Obstetrics and Gynecology

## 2021-08-11 ENCOUNTER — Ambulatory Visit (INDEPENDENT_AMBULATORY_CARE_PROVIDER_SITE_OTHER): Payer: 59 | Admitting: Obstetrics and Gynecology

## 2021-08-11 ENCOUNTER — Other Ambulatory Visit (HOSPITAL_COMMUNITY)
Admission: RE | Admit: 2021-08-11 | Discharge: 2021-08-11 | Disposition: A | Discharge status: Home / Self Care | Payer: Medicaid Other | Source: Ambulatory Visit | Attending: Obstetrics and Gynecology | Admitting: Obstetrics and Gynecology

## 2021-08-11 ENCOUNTER — Other Ambulatory Visit: Payer: Self-pay

## 2021-08-11 VITALS — BP 132/80 | HR 80 | Wt 258.1 lb

## 2021-08-11 DIAGNOSIS — Z1379 Encounter for other screening for genetic and chromosomal anomalies: Secondary | ICD-10-CM | POA: Diagnosis not present

## 2021-08-11 DIAGNOSIS — Z3A12 12 weeks gestation of pregnancy: Secondary | ICD-10-CM

## 2021-08-11 DIAGNOSIS — Z124 Encounter for screening for malignant neoplasm of cervix: Secondary | ICD-10-CM | POA: Diagnosis not present

## 2021-08-11 DIAGNOSIS — Z3401 Encounter for supervision of normal first pregnancy, first trimester: Secondary | ICD-10-CM

## 2021-08-11 LAB — POCT URINALYSIS DIPSTICK OB
Bilirubin, UA: NEGATIVE
Glucose, UA: NEGATIVE
Ketones, UA: NEGATIVE
Leukocytes, UA: NEGATIVE
Nitrite, UA: NEGATIVE
POC,PROTEIN,UA: NEGATIVE
Spec Grav, UA: 1.02 (ref 1.010–1.025)
Urobilinogen, UA: 0.2 E.U./dL
pH, UA: 6.5 (ref 5.0–8.0)

## 2021-08-11 NOTE — Progress Notes (Addendum)
NOB: Has no complaints.  Still undecided TOLAC versus repeat cesarean.  Questions answered today.  I informed her she could easily decide later in pregnancy.  Desires MaterniT 21 today.  Pap performed.  ASA 81.  aFP next visit  Physical examination General NAD, Conversant  HEENT Atraumatic; Op clear with mmm.  Normo-cephalic. Pupils reactive. Anicteric sclerae  Thyroid/Neck Smooth without nodularity or enlargement. Normal ROM.  Neck Supple.  Skin No rashes, lesions or ulceration. Normal palpated skin turgor. No nodularity.  Breasts: No masses or discharge.  Symmetric.  No axillary adenopathy.  Lungs: Clear to auscultation.No rales or wheezes. Normal Respiratory effort, no retractions.  Heart: NSR.  No murmurs or rubs appreciated. No periferal edema  Abdomen: Soft.  Non-tender.  No masses.  No HSM. No hernia  Extremities: Moves all appropriately.  Normal ROM for age. No lymphadenopathy.  Neuro: Oriented to PPT.  Normal mood. Normal affect.     Pelvic:   Vulva: Normal appearance.  No lesions.  Vagina: No lesions or abnormalities noted.  Support: Normal pelvic support.  Urethra No masses tenderness or scarring.  Meatus Normal size without lesions or prolapse.  Cervix: Normal appearance.  No lesions.  Anus: Normal exam.  No lesions.  Perineum: Normal exam.  No lesions.        Bimanual   Adnexae: No masses.  Non-tender to palpation.  Uterus: Enlarged.  12 weeks non-tender.  Mobile.  AV.  Adnexae: No masses.  Non-tender to palpation.  Cul-de-sac: Negative for abnormality.  Adnexae: No masses.  Non-tender to palpation.         Pelvimetry   Diagonal: Reached.  Spines: Average.  Sacrum: Concave.  Pubic Arch: Normal.

## 2021-08-11 NOTE — Progress Notes (Signed)
NOB: Physical: She has no concerns today. Feels well.

## 2021-08-11 NOTE — Telephone Encounter (Signed)
Patient called asking for a letter for her dentist regarding her invisalign. Patient also wanted me to make provider aware that she does not wish to know the gender, asking if she can have the gender picked up. Pt stated that letter can be sent via mychart. Please Advise.

## 2021-08-12 LAB — CYTOLOGY - PAP
Comment: NEGATIVE
Diagnosis: NEGATIVE
High risk HPV: NEGATIVE

## 2021-08-12 LAB — CBC
Hematocrit: 37.3 % (ref 34.0–46.6)
Hemoglobin: 12.6 g/dL (ref 11.1–15.9)
MCH: 29.3 pg (ref 26.6–33.0)
MCHC: 33.8 g/dL (ref 31.5–35.7)
MCV: 87 fL (ref 79–97)
Platelets: 307 10*3/uL (ref 150–450)
RBC: 4.3 x10E6/uL (ref 3.77–5.28)
RDW: 12.6 % (ref 11.7–15.4)
WBC: 9.1 10*3/uL (ref 3.4–10.8)

## 2021-08-15 LAB — MATERNIT21  PLUS CORE+ESS+SCA, BLOOD
11q23 deletion (Jacobsen): NOT DETECTED
15q11 deletion (PW Angelman): NOT DETECTED
1p36 deletion syndrome: NOT DETECTED
22q11 deletion (DiGeorge): NOT DETECTED
4p16 deletion(Wolf-Hirschhorn): NOT DETECTED
5p15 deletion (Cri-du-chat): NOT DETECTED
8q24 deletion (Langer-Giedion): NOT DETECTED
Fetal Fraction: 4
Monosomy X (Turner Syndrome): NOT DETECTED
Result (T21): NEGATIVE
Trisomy 13 (Patau syndrome): NEGATIVE
Trisomy 16: NOT DETECTED
Trisomy 18 (Edwards syndrome): NEGATIVE
Trisomy 21 (Down syndrome): NEGATIVE
Trisomy 22: NOT DETECTED
XXX (Triple X Syndrome): NOT DETECTED
XXY (Klinefelter Syndrome): NOT DETECTED
XYY (Jacobs Syndrome): NOT DETECTED

## 2021-08-16 ENCOUNTER — Telehealth: Payer: Self-pay | Admitting: Obstetrics and Gynecology

## 2021-08-16 NOTE — Telephone Encounter (Signed)
Called patient. She was prescribed antibiotics by her PCP. Told her to call and get diflucan from PCP.

## 2021-08-16 NOTE — Telephone Encounter (Signed)
Patient states that she thinks that she may have a yeast infection from the antibiotics that was prescribed. Symptoms include discharge and itching

## 2021-08-16 NOTE — Telephone Encounter (Signed)
Patient states that she is still waiting on the letter that was suppose to be uploaded into her MyChart for her dentist

## 2021-08-19 NOTE — Telephone Encounter (Signed)
Letter has been sent to [facility] for dental procedures.

## 2021-09-14 NOTE — Patient Instructions (Incomplete)

## 2021-09-15 ENCOUNTER — Encounter: Payer: Self-pay | Admitting: Obstetrics and Gynecology

## 2021-09-15 ENCOUNTER — Ambulatory Visit (INDEPENDENT_AMBULATORY_CARE_PROVIDER_SITE_OTHER): Payer: 59 | Admitting: Obstetrics and Gynecology

## 2021-09-15 ENCOUNTER — Other Ambulatory Visit: Payer: Self-pay

## 2021-09-15 ENCOUNTER — Encounter: Payer: 59 | Admitting: Obstetrics and Gynecology

## 2021-09-15 VITALS — BP 120/67 | HR 88 | Wt 259.8 lb

## 2021-09-15 DIAGNOSIS — Z6841 Body Mass Index (BMI) 40.0 and over, adult: Secondary | ICD-10-CM

## 2021-09-15 DIAGNOSIS — Z3689 Encounter for other specified antenatal screening: Secondary | ICD-10-CM

## 2021-09-15 DIAGNOSIS — N949 Unspecified condition associated with female genital organs and menstrual cycle: Secondary | ICD-10-CM

## 2021-09-15 DIAGNOSIS — Z3482 Encounter for supervision of other normal pregnancy, second trimester: Secondary | ICD-10-CM

## 2021-09-15 DIAGNOSIS — Z131 Encounter for screening for diabetes mellitus: Secondary | ICD-10-CM

## 2021-09-15 DIAGNOSIS — O34219 Maternal care for unspecified type scar from previous cesarean delivery: Secondary | ICD-10-CM

## 2021-09-15 DIAGNOSIS — Z3A17 17 weeks gestation of pregnancy: Secondary | ICD-10-CM

## 2021-09-15 DIAGNOSIS — O0992 Supervision of high risk pregnancy, unspecified, second trimester: Secondary | ICD-10-CM

## 2021-09-15 LAB — POCT URINALYSIS DIPSTICK OB
Bilirubin, UA: NEGATIVE
Blood, UA: NEGATIVE
Glucose, UA: NEGATIVE
Ketones, UA: NEGATIVE
Leukocytes, UA: NEGATIVE
Nitrite, UA: NEGATIVE
POC,PROTEIN,UA: NEGATIVE
Spec Grav, UA: 1.015 (ref 1.010–1.025)
Urobilinogen, UA: 0.2 E.U./dL
pH, UA: 6 (ref 5.0–8.0)

## 2021-09-15 MED ORDER — PRENATAL VITAMIN 27-0.8 MG PO TABS: 27.0000 mg | ORAL_TABLET | Freq: Every day | ORAL | 11 refills | Status: DC

## 2021-09-15 NOTE — Progress Notes (Signed)
  OB-Pt present for routine prenatal care. Pt stated fetal movement no sure if she is feeling the baby; no  contractions present; no vaginal bleeding and no changes in vaginal discharge.     Pt c/o round ligament pains.  Pt declined flu vaccine.

## 2021-09-16 NOTE — Progress Notes (Signed)
ROB: Patient has complaints of round ligament pain. Advised on comfort measures/stretches.  Not sure if she is yet detecting fetal movement. Declines flu vaccine. For AFP today but lab is closing. Will have patient repeat at next visit, along with HgbA1c due to obesity (not done with other NOB labs). Normal MaterniT21.  Patient has questions about TOLAC vs repeat C-section.  Briefly discussed, can continue discussion in upcoming visits. Plans to breastfeed. RTC in 3-4 weeks, for anatomy scan at that time.

## 2021-10-06 ENCOUNTER — Other Ambulatory Visit: Payer: Self-pay

## 2021-10-06 ENCOUNTER — Ambulatory Visit (INDEPENDENT_AMBULATORY_CARE_PROVIDER_SITE_OTHER): Payer: 59

## 2021-10-06 ENCOUNTER — Ambulatory Visit (INDEPENDENT_AMBULATORY_CARE_PROVIDER_SITE_OTHER): Payer: 59 | Admitting: Obstetrics and Gynecology

## 2021-10-06 ENCOUNTER — Other Ambulatory Visit: Payer: 59

## 2021-10-06 ENCOUNTER — Encounter: Payer: Self-pay | Admitting: Obstetrics and Gynecology

## 2021-10-06 VITALS — BP 118/64 | HR 81 | Wt 258.6 lb

## 2021-10-06 DIAGNOSIS — Z3689 Encounter for other specified antenatal screening: Secondary | ICD-10-CM

## 2021-10-06 DIAGNOSIS — Z3482 Encounter for supervision of other normal pregnancy, second trimester: Secondary | ICD-10-CM

## 2021-10-06 DIAGNOSIS — Z3A20 20 weeks gestation of pregnancy: Secondary | ICD-10-CM

## 2021-10-06 LAB — POCT URINALYSIS DIPSTICK OB
Bilirubin, UA: NEGATIVE
Blood, UA: NEGATIVE
Glucose, UA: NEGATIVE
Ketones, UA: NEGATIVE
Leukocytes, UA: NEGATIVE
Nitrite, UA: NEGATIVE
POC,PROTEIN,UA: NEGATIVE
Spec Grav, UA: 1.025 (ref 1.010–1.025)
Urobilinogen, UA: 0.2 E.U./dL
pH, UA: 6 (ref 5.0–8.0)

## 2021-10-06 NOTE — Progress Notes (Signed)
ROB. Patient states she has been experiencing pelvic pressure. Patient is curious about the risks of being sexually active.

## 2021-10-06 NOTE — Progress Notes (Signed)
ROB: Feeling the baby move every day.  She states she feels very well-her energy has returned.  Anatomy ultrasound today.  Still has some pelvic pressure symptoms with these have improved.

## 2021-10-10 ENCOUNTER — Other Ambulatory Visit: Payer: Self-pay | Admitting: Obstetrics and Gynecology

## 2021-10-10 DIAGNOSIS — Z362 Encounter for other antenatal screening follow-up: Secondary | ICD-10-CM

## 2021-10-11 NOTE — Addendum Note (Signed)
Addended by: Fabian November on: 10/11/2021 11:03 PM   Modules accepted: Level of Service

## 2021-10-12 ENCOUNTER — Encounter: Payer: Self-pay | Admitting: Obstetrics and Gynecology

## 2021-10-12 LAB — AFP, SERUM, OPEN SPINA BIFIDA
AFP MoM: 0.74
AFP Value: 35.8 ng/mL
Gest. Age on Collection Date: 20.9 weeks
Maternal Age At EDD: 32.7 yr
OSBR Risk 1 IN: 10000
Test Results:: NEGATIVE
Weight: 258 [lb_av]

## 2021-10-20 ENCOUNTER — Ambulatory Visit (INDEPENDENT_AMBULATORY_CARE_PROVIDER_SITE_OTHER): Payer: 59

## 2021-10-20 ENCOUNTER — Other Ambulatory Visit: Payer: Self-pay

## 2021-10-20 DIAGNOSIS — Z362 Encounter for other antenatal screening follow-up: Secondary | ICD-10-CM | POA: Diagnosis not present

## 2021-11-03 ENCOUNTER — Encounter: Payer: Self-pay | Admitting: Obstetrics and Gynecology

## 2021-11-03 ENCOUNTER — Other Ambulatory Visit: Payer: Self-pay

## 2021-11-03 ENCOUNTER — Ambulatory Visit (INDEPENDENT_AMBULATORY_CARE_PROVIDER_SITE_OTHER): Payer: 59 | Admitting: Obstetrics and Gynecology

## 2021-11-03 VITALS — BP 119/77 | HR 84 | Wt 261.3 lb

## 2021-11-03 DIAGNOSIS — Z131 Encounter for screening for diabetes mellitus: Secondary | ICD-10-CM

## 2021-11-03 DIAGNOSIS — O34219 Maternal care for unspecified type scar from previous cesarean delivery: Secondary | ICD-10-CM

## 2021-11-03 DIAGNOSIS — Z3A24 24 weeks gestation of pregnancy: Secondary | ICD-10-CM

## 2021-11-03 DIAGNOSIS — Z3482 Encounter for supervision of other normal pregnancy, second trimester: Secondary | ICD-10-CM

## 2021-11-03 DIAGNOSIS — Z6841 Body Mass Index (BMI) 40.0 and over, adult: Secondary | ICD-10-CM

## 2021-11-03 LAB — POCT URINALYSIS DIPSTICK OB
Bilirubin, UA: NEGATIVE
Blood, UA: NEGATIVE
Glucose, UA: NEGATIVE
Ketones, UA: NEGATIVE
Leukocytes, UA: NEGATIVE
Nitrite, UA: NEGATIVE
POC,PROTEIN,UA: NEGATIVE
Spec Grav, UA: 1.025 (ref 1.010–1.025)
Urobilinogen, UA: 0.2 E.U./dL
pH, UA: 6 (ref 5.0–8.0)

## 2021-11-03 NOTE — Progress Notes (Signed)
ROB: Notes some discomfort with sex, discussed different positions.  Also noting some issues with circulation in her feet. Is trying to walk more. Discussed TOLAC vs repeat C-section, notes that she has given it some thought and would like to go for TOLAC. Also notes that she plans to have a doula. Discussed concerns regarding last birth. Advised on weight gain in pregnancy, also discussed recommendations for obesity in pregnancy and IOL by 40 weeks.  RTC in 4 weeks, for 28 week labs at that time. Discussed contraception, patient notes she would like to have BTL. Can sign papers next visit.

## 2021-11-03 NOTE — Progress Notes (Signed)
ROB. Patient reports fetal movement with pain/pressure and discomfort. Patient states a constant dull discomfort around her belly and sides.   Patient states no other questions or concerns at this time.

## 2021-11-07 ENCOUNTER — Encounter: Payer: Self-pay | Admitting: Obstetrics and Gynecology

## 2021-12-01 ENCOUNTER — Other Ambulatory Visit: Payer: Self-pay

## 2021-12-01 ENCOUNTER — Other Ambulatory Visit: Payer: Medicaid Other

## 2021-12-01 ENCOUNTER — Ambulatory Visit (INDEPENDENT_AMBULATORY_CARE_PROVIDER_SITE_OTHER): Payer: Medicaid Other | Admitting: Obstetrics and Gynecology

## 2021-12-01 ENCOUNTER — Encounter: Payer: Self-pay | Admitting: Obstetrics and Gynecology

## 2021-12-01 VITALS — BP 123/74 | HR 82 | Wt 265.2 lb

## 2021-12-01 DIAGNOSIS — Z23 Encounter for immunization: Secondary | ICD-10-CM

## 2021-12-01 DIAGNOSIS — Z3A28 28 weeks gestation of pregnancy: Secondary | ICD-10-CM

## 2021-12-01 DIAGNOSIS — O99210 Obesity complicating pregnancy, unspecified trimester: Secondary | ICD-10-CM

## 2021-12-01 DIAGNOSIS — Z3483 Encounter for supervision of other normal pregnancy, third trimester: Secondary | ICD-10-CM

## 2021-12-01 DIAGNOSIS — Z3482 Encounter for supervision of other normal pregnancy, second trimester: Secondary | ICD-10-CM

## 2021-12-01 DIAGNOSIS — Z131 Encounter for screening for diabetes mellitus: Secondary | ICD-10-CM

## 2021-12-01 LAB — POCT URINALYSIS DIPSTICK OB
Bilirubin, UA: NEGATIVE
Blood, UA: NEGATIVE
Glucose, UA: NEGATIVE
Ketones, UA: NEGATIVE
Leukocytes, UA: NEGATIVE
Nitrite, UA: NEGATIVE
Spec Grav, UA: 1.015 (ref 1.010–1.025)
Urobilinogen, UA: 0.2 E.U./dL
pH, UA: 7 (ref 5.0–8.0)

## 2021-12-01 NOTE — Progress Notes (Signed)
ROB: Reports daily fetal movement.  Occasional achy lower extremity pain-discussed.  Discussed permanent sterilization.  Patient would like another month to decide.  She is "not ready" to sign papers today.  1 hour GCT today.  Consider ultrasound for growth later in the third trimester.TOLAC discussed.  Induction for BMI discussed. (Not taking aspirin)

## 2021-12-01 NOTE — Progress Notes (Signed)
ROB: She is doing well, no new concerns. BTC form and TDAP done today.

## 2021-12-02 LAB — CBC
Hematocrit: 34.9 % (ref 34.0–46.6)
Hemoglobin: 11.5 g/dL (ref 11.1–15.9)
MCH: 28.5 pg (ref 26.6–33.0)
MCHC: 33 g/dL (ref 31.5–35.7)
MCV: 86 fL (ref 79–97)
Platelets: 288 10*3/uL (ref 150–450)
RBC: 4.04 x10E6/uL (ref 3.77–5.28)
RDW: 12.7 % (ref 11.7–15.4)
WBC: 7.9 10*3/uL (ref 3.4–10.8)

## 2021-12-02 LAB — RPR: RPR Ser Ql: NONREACTIVE

## 2021-12-02 LAB — GLUCOSE, 1 HOUR GESTATIONAL: Gestational Diabetes Screen: 192 mg/dL — ABNORMAL HIGH (ref 70–139)

## 2021-12-02 LAB — ABO AND RH: Rh Factor: POSITIVE

## 2021-12-02 LAB — ANTIBODY SCREEN: Antibody Screen: NEGATIVE

## 2021-12-08 ENCOUNTER — Other Ambulatory Visit: Payer: Self-pay

## 2021-12-08 ENCOUNTER — Other Ambulatory Visit: Payer: Medicaid Other

## 2021-12-08 DIAGNOSIS — Z131 Encounter for screening for diabetes mellitus: Secondary | ICD-10-CM

## 2021-12-09 LAB — GESTATIONAL GLUCOSE TOLERANCE
Glucose, Fasting: 80 mg/dL (ref 70–94)
Glucose, GTT - 1 Hour: 198 mg/dL — ABNORMAL HIGH (ref 70–179)
Glucose, GTT - 2 Hour: 150 mg/dL (ref 70–154)
Glucose, GTT - 3 Hour: 67 mg/dL — ABNORMAL LOW (ref 70–139)

## 2021-12-11 NOTE — Progress Notes (Signed)
You have passed your glucose test and are not considered to have gestational diabetes! Dr. Logan Bores

## 2021-12-23 ENCOUNTER — Telehealth: Payer: Self-pay | Admitting: Obstetrics and Gynecology

## 2021-12-23 MED ORDER — PRENATAL VITAMIN 27-0.8 MG PO TABS
27.0000 mg | ORAL_TABLET | Freq: Every day | ORAL | 11 refills | Status: AC
Start: 2021-12-23 — End: ?

## 2021-12-23 NOTE — Telephone Encounter (Signed)
Done

## 2021-12-23 NOTE — Telephone Encounter (Signed)
Pt called requesting refill on prenatal vitamins  Pharmacy: [facility] [address]

## 2021-12-24 ENCOUNTER — Telehealth: Payer: Self-pay | Admitting: Obstetrics and Gynecology

## 2021-12-24 NOTE — Telephone Encounter (Signed)
Pt stated that she might have a poss yeast infection. Patient stated she would like to have a RX sent to her pharmacy for treatment if poss. Stated that she also need a STD Screening. Please advise.

## 2021-12-24 NOTE — Telephone Encounter (Signed)
Spoke with patient regarding concern for yeast infection. Patient states pain while wiping with occasional blood, burning, itching, yellow/green discharge once, white discharge now. Patient also states she would like to be tested for STD's. Patient states no new partner or known exposure. Patient was recommended to try monistat OTC. Patient made aware at her next visit a culture can be preformed for STD and yeast infection.

## 2021-12-28 ENCOUNTER — Ambulatory Visit (INDEPENDENT_AMBULATORY_CARE_PROVIDER_SITE_OTHER): Payer: Medicaid Other | Admitting: Obstetrics and Gynecology

## 2021-12-28 ENCOUNTER — Encounter: Payer: Self-pay | Admitting: Obstetrics and Gynecology

## 2021-12-28 ENCOUNTER — Other Ambulatory Visit: Payer: Self-pay

## 2021-12-28 ENCOUNTER — Other Ambulatory Visit (HOSPITAL_COMMUNITY)
Admission: RE | Admit: 2021-12-28 | Discharge: 2021-12-28 | Disposition: A | Discharge status: Home / Self Care | Payer: Medicaid Other | Source: Ambulatory Visit | Attending: Obstetrics and Gynecology | Admitting: Obstetrics and Gynecology

## 2021-12-28 VITALS — BP 125/61 | HR 95 | Wt 261.6 lb

## 2021-12-28 DIAGNOSIS — Z113 Encounter for screening for infections with a predominantly sexual mode of transmission: Secondary | ICD-10-CM | POA: Diagnosis present

## 2021-12-28 DIAGNOSIS — O98319 Other infections with a predominantly sexual mode of transmission complicating pregnancy, unspecified trimester: Secondary | ICD-10-CM

## 2021-12-28 DIAGNOSIS — Z3483 Encounter for supervision of other normal pregnancy, third trimester: Secondary | ICD-10-CM | POA: Diagnosis not present

## 2021-12-28 DIAGNOSIS — N898 Other specified noninflammatory disorders of vagina: Secondary | ICD-10-CM

## 2021-12-28 DIAGNOSIS — Z3A32 32 weeks gestation of pregnancy: Secondary | ICD-10-CM | POA: Insufficient documentation

## 2021-12-28 DIAGNOSIS — Z8619 Personal history of other infectious and parasitic diseases: Secondary | ICD-10-CM

## 2021-12-28 DIAGNOSIS — Z3A00 Weeks of gestation of pregnancy not specified: Secondary | ICD-10-CM

## 2021-12-28 DIAGNOSIS — O99210 Obesity complicating pregnancy, unspecified trimester: Secondary | ICD-10-CM

## 2021-12-28 LAB — POCT URINALYSIS DIPSTICK OB
Bilirubin, UA: NEGATIVE
Blood, UA: NEGATIVE
Glucose, UA: NEGATIVE
Ketones, UA: NEGATIVE
Leukocytes, UA: NEGATIVE
Nitrite, UA: NEGATIVE
Spec Grav, UA: 1.025 (ref 1.010–1.025)
Urobilinogen, UA: 0.2 E.U./dL
pH, UA: 6.5 (ref 5.0–8.0)

## 2021-12-28 MED ORDER — VALACYCLOVIR HCL 500 MG PO TABS: 500.0000 mg | ORAL_TABLET | Freq: Two times a day (BID) | ORAL | 6 refills | Status: DC

## 2021-12-28 NOTE — Telephone Encounter (Signed)
Attempted to call patient to follow up on symptoms. No answer, unable to lvm

## 2021-12-28 NOTE — Progress Notes (Addendum)
ROB: Reports taking an antibiotic for a sinus infection, then felt like she got a yeast infection. Was advised to try OTC Monistat but notes she didn't because her symptoms started to resolve.  Is now starting to feel more cramping, and also feels burning with urination like a cut.  Reports that she does have a h/o HSV in the past, last outbreak with with last pregnancy. Will start on Valtrex for treatment, then recommend starting suppressive therapy as patient with h/o previous preterm delivery at 35 weeks. Nuswab performed.  Will schedule for growth scan for next visit.  Will need to begin antenatal testing for obesity at 36 weeks. RTC in 2 weeks.

## 2021-12-28 NOTE — Telephone Encounter (Signed)
Patient states feeling itchiness and cramping in lower abdomen. Patient states still having small amounts of blood while wiping along with white discharge that now has an odor. Patient states after using the monistat OTC her burning went away itching decreased. Patient states her main concern now is the cramping and pelvic pressure she is feeling. Patient has been offered an appointment  with Dr. Valentino Saxon for this afternoon and accepted.

## 2021-12-28 NOTE — Progress Notes (Signed)
ROB: She thinks she may have been exposed to STD. She said it burns when she wipes and it feels like small cuts.

## 2021-12-29 ENCOUNTER — Encounter: Payer: Medicaid Other | Admitting: Obstetrics and Gynecology

## 2021-12-30 ENCOUNTER — Other Ambulatory Visit: Payer: Self-pay

## 2021-12-30 ENCOUNTER — Encounter: Payer: Medicaid Other | Admitting: Obstetrics and Gynecology

## 2021-12-30 ENCOUNTER — Encounter: Payer: Self-pay | Admitting: Obstetrics and Gynecology

## 2021-12-30 DIAGNOSIS — Z3A33 33 weeks gestation of pregnancy: Secondary | ICD-10-CM

## 2021-12-30 DIAGNOSIS — B9689 Other specified bacterial agents as the cause of diseases classified elsewhere: Secondary | ICD-10-CM

## 2021-12-30 DIAGNOSIS — Z3483 Encounter for supervision of other normal pregnancy, third trimester: Secondary | ICD-10-CM

## 2021-12-30 DIAGNOSIS — B379 Candidiasis, unspecified: Secondary | ICD-10-CM

## 2021-12-30 DIAGNOSIS — N898 Other specified noninflammatory disorders of vagina: Secondary | ICD-10-CM

## 2021-12-30 DIAGNOSIS — N760 Acute vaginitis: Secondary | ICD-10-CM

## 2021-12-30 LAB — CERVICOVAGINAL ANCILLARY ONLY
Bacterial Vaginitis (gardnerella): POSITIVE — AB
Candida Glabrata: NEGATIVE
Candida Vaginitis: POSITIVE — AB
Chlamydia: NEGATIVE
Comment: NEGATIVE
Comment: NEGATIVE
Comment: NEGATIVE
Comment: NEGATIVE
Comment: NEGATIVE
Comment: NORMAL
Neisseria Gonorrhea: NEGATIVE
Trichomonas: NEGATIVE

## 2021-12-30 MED ORDER — METRONIDAZOLE 0.75 % VA GEL: 1.0000 | Freq: Every evening | VAGINAL | 0 refills | Status: AC

## 2021-12-30 MED ORDER — FLUCONAZOLE 150 MG PO TABS: 150.0000 mg | ORAL_TABLET | Freq: Once | ORAL | 1 refills | Status: AC

## 2022-01-12 ENCOUNTER — Ambulatory Visit (INDEPENDENT_AMBULATORY_CARE_PROVIDER_SITE_OTHER): Payer: Medicaid Other | Admitting: Obstetrics and Gynecology

## 2022-01-12 ENCOUNTER — Other Ambulatory Visit: Payer: Self-pay | Admitting: Obstetrics and Gynecology

## 2022-01-12 ENCOUNTER — Ambulatory Visit (INDEPENDENT_AMBULATORY_CARE_PROVIDER_SITE_OTHER): Payer: 59

## 2022-01-12 ENCOUNTER — Other Ambulatory Visit: Payer: Self-pay

## 2022-01-12 ENCOUNTER — Encounter: Payer: Self-pay | Admitting: Obstetrics and Gynecology

## 2022-01-12 VITALS — BP 122/74 | HR 86 | Wt 265.5 lb

## 2022-01-12 DIAGNOSIS — O0993 Supervision of high risk pregnancy, unspecified, third trimester: Secondary | ICD-10-CM

## 2022-01-12 DIAGNOSIS — Z8751 Personal history of pre-term labor: Secondary | ICD-10-CM

## 2022-01-12 DIAGNOSIS — Z3483 Encounter for supervision of other normal pregnancy, third trimester: Secondary | ICD-10-CM

## 2022-01-12 DIAGNOSIS — Z3A34 34 weeks gestation of pregnancy: Secondary | ICD-10-CM

## 2022-01-12 LAB — POCT URINALYSIS DIPSTICK OB
Bilirubin, UA: NEGATIVE
Blood, UA: NEGATIVE
Clarity, UA: NEGATIVE
Glucose, UA: NEGATIVE
Ketones, UA: NEGATIVE
Leukocytes, UA: NEGATIVE
Nitrite, UA: NEGATIVE
POC,PROTEIN,UA: NEGATIVE
Spec Grav, UA: 1.02 (ref 1.010–1.025)
Urobilinogen, UA: 0.2 E.U./dL
pH, UA: 6 (ref 5.0–8.0)

## 2022-01-12 NOTE — Progress Notes (Signed)
ROB: Patient states yeast and HSV symptoms resolved.  She is not currently taking suppressive medicine.  To begin at 36 weeks.  Needs cultures next visit.  NSTs to begin next visit.  Ultrasound today shows baby at the 65th percentile.  88th percentile for abdominal circumference!!  6 pounds 7 ounces. (Will this influence her desire for vaginal birth?)  Tubal papers signed today.  Patient to speak with her supervisor at work about getting a job where she can sit down more frequently.  If this does not work she may need restricted standing papers.  Patient complains of occasional pelvic discomfort and pressure.  Denies contractions. ?

## 2022-01-12 NOTE — Progress Notes (Signed)
ROB. Patient states fetal movement and lots of pain and pressure. She states concerns in standing for long periods of time at work. Patient states no questions or concerns at this time.   ?

## 2022-01-17 ENCOUNTER — Encounter: Payer: Self-pay | Admitting: Obstetrics and Gynecology

## 2022-01-25 NOTE — Patient Instructions (Incomplete)
Breast Self-Awareness ?Breast self-awareness is knowing how your breasts look and feel. Doing breast self-awareness is important. It allows you to catch a breast problem early while it is still small and can be treated. All women should do breast self-awareness, including women who have had breast implants. Tell your doctor if you notice a change in your breasts. ?What you need: ?A mirror. ?A well-lit room. ?How to do a breast self-exam ?A breast self-exam is one way to learn what is normal for your breasts and to check for changes. To do a breast self-exam: ?Look for changes ? ?Take off all the clothes above your waist. ?Stand in front of a mirror in a room with good lighting. ?Put your hands on your hips. ?Push your hands down. ?Look at your breasts and nipples in the mirror to see if one breast or nipple looks different from the other. Check to see if: ?The shape of one breast is different. ?The size of one breast is different. ?There are wrinkles, dips, and bumps in one breast and not the other. ?Look at each breast for changes in the skin, such as: ?Redness. ?Scaly areas. ?Look for changes in your nipples, such as: ?Liquid around the nipples. ?Bleeding. ?Dimpling. ?Redness. ?A change in where the nipples are. ?Feel for changes ? ?Lie on your back on the floor. ?Feel each breast. To do this, follow these steps: ?Pick a breast to feel. ?Put the arm closest to that breast above your head. ?Use your other arm to feel the nipple area of your breast. Feel the area with the pads of your three middle fingers by making small circles with your fingers. For the first circle, press lightly. For the second circle, press harder. For the third circle, press even harder. ?Keep making circles with your fingers at the different pressures as you move down your breast. Stop when you feel your ribs. ?Move your fingers a little toward the center of your body. ?Start making circles with your fingers again, this time going up until  you reach your collarbone. ?Keep making up-and-down circles until you reach your armpit. Remember to keep using the three pressures. ?Feel the other breast in the same way. ?Sit or stand in the tub or shower. ?With soapy water on your skin, feel each breast the same way you did in step 2 when you were lying on the floor. ?Write down what you find ?Writing down what you find can help you remember what to tell your doctor. Write down: ?What is normal for each breast. ?Any changes you find in each breast, including: ?The kind of changes you find. ?Whether you have pain. ?Size and location of any lumps. ?When you last had your menstrual period. ?General tips ?Check your breasts every month. ?If you are breastfeeding, the best time to check your breasts is after you feed your baby or after you use a breast pump. ?If you get menstrual periods, the best time to check your breasts is 5-7 days after your menstrual period is over. ?With time, you will become comfortable with the self-exam, and you will begin to know if there are changes in your breasts. ?Contact a doctor if you: ?See a change in the shape or size of your breasts or nipples. ?See a change in the skin of your breast or nipples, such as red or scaly skin. ?Have fluid coming from your nipples that is not normal. ?Find a lump or thick area that was not there before. ?Have pain in   your breasts. ?Have any concerns about your breast health. ?Summary ?Breast self-awareness includes looking for changes in your breasts, as well as feeling for changes within your breasts. ?Breast self-awareness should be done in front of a mirror in a well-lit room. ?You should check your breasts every month. If you get menstrual periods, the best time to check your breasts is 5-7 days after your menstrual period is over. ?Let your doctor know of any changes you see in your breasts, including changes in size, changes on the skin, pain or tenderness, or fluid from your nipples that is not  normal. ?This information is not intended to replace advice given to you by your health care provider. Make sure you discuss any questions you have with your health care provider. ?Document Revised: 06/12/2018 Document Reviewed: 06/12/2018 ?Elsevier Patient Education ? [employer]. ?Preventive Care 20-3 Years Old, Female ?Preventive care refers to lifestyle choices and visits with your health care provider that can promote health and wellness. Preventive care visits are also called wellness exams. ?What can I expect for my preventive care visit? ?Counseling ?During your preventive care visit, your health care provider may ask about your: ?Medical history, including: ?Past medical problems. ?Family medical history. ?Pregnancy history. ?Current health, including: ?Menstrual cycle. ?Method of birth control. ?Emotional well-being. ?Home life and relationship well-being. ?Sexual activity and sexual health. ?Lifestyle, including: ?Alcohol, nicotine or tobacco, and drug use. ?Access to firearms. ?Diet, exercise, and sleep habits. ?Work and work Statistician. ?Sunscreen use. ?Safety issues such as seatbelt and bike helmet use. ?Physical exam ?Your health care provider may check your: ?Height and weight. These may be used to calculate your BMI (body mass index). BMI is a measurement that tells if you are at a healthy weight. ?Waist circumference. This measures the distance around your waistline. This measurement also tells if you are at a healthy weight and may help predict your risk of certain diseases, such as type 2 diabetes and high blood pressure. ?Heart rate and blood pressure. ?Body temperature. ?Skin for abnormal spots. ?What immunizations do I need? ?Vaccines are usually given at various ages, according to a schedule. Your health care provider will recommend vaccines for you based on your age, medical history, and lifestyle or other factors, such as travel or where you work. ?What tests do I  need? ?Screening ?Your health care provider may recommend screening tests for certain conditions. This may include: ?Pelvic exam and Pap test. ?Lipid and cholesterol levels. ?Diabetes screening. This is done by checking your blood sugar (glucose) after you have not eaten for a while (fasting). ?Hepatitis B test. ?Hepatitis C test. ?HIV (human immunodeficiency virus) test. ?STI (sexually transmitted infection) testing, if you are at risk. ?BRCA-related cancer screening. This may be done if you have a family history of breast, ovarian, tubal, or peritoneal cancers. ?Talk with your health care provider about your test results, treatment options, and if necessary, the need for more tests. ?Follow these instructions at home: ?Eating and drinking ? ?Eat a healthy diet that includes fresh fruits and vegetables, whole grains, lean protein, and low-fat dairy products. ?Take vitamin and mineral supplements as recommended by your health care provider. ?Do not drink alcohol if: ?Your health care provider tells you not to drink. ?You are pregnant, may be pregnant, or are planning to become pregnant. ?If you drink alcohol: ?Limit how much you have to 0-1 drink a day. ?Know how much alcohol is in your drink. In the [place] one drink equals one 12 oz  bottle of beer (355 mL), one 5 oz glass of wine (148 mL), or one 1? oz glass of hard liquor (44 mL). ?Lifestyle ?Brush your teeth every morning and night with fluoride toothpaste. Floss one time each day. ?Exercise for at least 30 minutes 5 or more days each week. ?Do not use any products that contain nicotine or tobacco. These products include cigarettes, chewing tobacco, and vaping devices, such as e-cigarettes. If you need help quitting, ask your health care provider. ?Do not use drugs. ?If you are sexually active, practice safe sex. Use a condom or other form of protection to prevent STIs. ?If you do not wish to become pregnant, use a form of birth control. If you plan to become  pregnant, see your health care provider for a prepregnancy visit. ?Find healthy ways to manage stress, such as: ?Meditation, yoga, or listening to music. ?Journaling. ?Talking to a trusted person. ?Spending time with friend

## 2022-01-26 ENCOUNTER — Other Ambulatory Visit: Payer: Medicaid Other

## 2022-01-26 ENCOUNTER — Encounter: Payer: Medicaid Other | Admitting: Obstetrics and Gynecology

## 2022-02-04 ENCOUNTER — Encounter: Payer: Self-pay | Admitting: Obstetrics and Gynecology

## 2022-02-04 ENCOUNTER — Other Ambulatory Visit: Payer: Medicaid Other

## 2022-02-04 ENCOUNTER — Ambulatory Visit (INDEPENDENT_AMBULATORY_CARE_PROVIDER_SITE_OTHER): Payer: 59 | Admitting: Obstetrics and Gynecology

## 2022-02-04 VITALS — BP 119/72 | HR 85 | Wt 271.4 lb

## 2022-02-04 DIAGNOSIS — Z3A38 38 weeks gestation of pregnancy: Secondary | ICD-10-CM

## 2022-02-04 DIAGNOSIS — O98513 Other viral diseases complicating pregnancy, third trimester: Secondary | ICD-10-CM

## 2022-02-04 DIAGNOSIS — Z3685 Encounter for antenatal screening for Streptococcus B: Secondary | ICD-10-CM | POA: Diagnosis not present

## 2022-02-04 DIAGNOSIS — O98313 Other infections with a predominantly sexual mode of transmission complicating pregnancy, third trimester: Secondary | ICD-10-CM | POA: Diagnosis not present

## 2022-02-04 DIAGNOSIS — U071 COVID-19: Secondary | ICD-10-CM

## 2022-02-04 DIAGNOSIS — O0993 Supervision of high risk pregnancy, unspecified, third trimester: Secondary | ICD-10-CM | POA: Diagnosis not present

## 2022-02-04 DIAGNOSIS — O34219 Maternal care for unspecified type scar from previous cesarean delivery: Secondary | ICD-10-CM

## 2022-02-04 DIAGNOSIS — Z3A00 Weeks of gestation of pregnancy not specified: Secondary | ICD-10-CM

## 2022-02-04 DIAGNOSIS — Z113 Encounter for screening for infections with a predominantly sexual mode of transmission: Secondary | ICD-10-CM

## 2022-02-04 DIAGNOSIS — O09893 Supervision of other high risk pregnancies, third trimester: Secondary | ICD-10-CM

## 2022-02-04 DIAGNOSIS — R69 Illness, unspecified: Secondary | ICD-10-CM | POA: Diagnosis not present

## 2022-02-04 DIAGNOSIS — O99213 Obesity complicating pregnancy, third trimester: Secondary | ICD-10-CM | POA: Diagnosis not present

## 2022-02-04 DIAGNOSIS — Z8619 Personal history of other infectious and parasitic diseases: Secondary | ICD-10-CM

## 2022-02-04 DIAGNOSIS — O99210 Obesity complicating pregnancy, unspecified trimester: Secondary | ICD-10-CM

## 2022-02-04 LAB — POCT URINALYSIS DIPSTICK OB
Bilirubin, UA: NEGATIVE
Glucose, UA: NEGATIVE
Ketones, UA: NEGATIVE
Nitrite, UA: NEGATIVE
Spec Grav, UA: 1.02 (ref 1.010–1.025)
Urobilinogen, UA: 0.2 E.U./dL
pH, UA: 6 (ref 5.0–8.0)

## 2022-02-04 LAB — FETAL NONSTRESS TEST

## 2022-02-04 MED ORDER — VALACYCLOVIR HCL 500 MG PO TABS: 500.0000 mg | ORAL_TABLET | Freq: Two times a day (BID) | ORAL | 0 refills | Status: DC

## 2022-02-04 NOTE — Patient Instructions (Addendum)
Natural Cervical Ripening Supplements ? ?Red Raspberry Leaf Tea ?2-4 cups daily.  Start at [redacted] weeks gestation and continue until labor.  ? ? ? ?Evening Primrose Oil (1000 mg) ?Take twice daily - by mouth in the morning and vaginally at bedtime. (If previous C-section, take both doses by mouth) ?Start at 38 weeks and continue until labor ? ? ? ?Blue Cohosh ?1 capsule daily starting at [redacted] weeks gestation ?2 capsules daily starting at [redacted] weeks gestation ?3 capsules daily starting at [redacted] weeks gestation  ? ? ?Signs and Symptoms of Labor ?Labor is the body's natural process of moving the baby and the placenta out of the uterus. The process of labor usually starts when the baby is full-term, between 11 and 41 weeks of pregnancy. ?Signs and symptoms that you are close to going into labor ?As your body prepares for labor and the birth of your baby, you may notice the following symptoms in the weeks and days before true labor starts: ?Passing a small amount of thick, bloody mucus from your vagina. This is called normal bloody show or losing your mucus plug. This may happen more than a week before labor begins, or right before labor begins, as the opening of the cervix starts to widen (dilate). For some women, the entire mucus plug passes at once. For others, pieces of the mucus plug may gradually pass over several days. ?Your baby moving (dropping) lower in your pelvis to get into position for birth (lightening). When this happens, you may feel more pressure on your bladder and pelvic bone and less pressure on your ribs. This may make it easier to breathe. It may also cause you to need to urinate more often and have problems with bowel movements. ?Having "practice contractions," also called Braxton Hicks contractions or false labor. These occur at irregular (unevenly spaced) intervals that are more than 10 minutes apart. False labor contractions are common after exercise or sexual activity. They will stop if you change  position, rest, or drink fluids. These contractions are usually mild and do not get stronger over time. They may feel like: ?A backache or back pain. ?Mild cramps, similar to menstrual cramps. ?Tightening or pressure in your abdomen. ?Other early symptoms include: ?Nausea or loss of appetite. ?Diarrhea. ?Having a sudden burst of energy, or feeling very tired. ?Mood changes. ?Having trouble sleeping. ?Signs and symptoms that labor has begun ?Signs that you are in labor may include: ?Having contractions that come at regular (evenly spaced) intervals and increase in intensity. This may feel like more intense tightening or pressure in your abdomen that moves to your back. ?Contractions may also feel like rhythmic pain in your upper thighs or back that comes and goes at regular intervals. ?If you are delivering for the first time, this change in intensity of contractions often occurs at a more gradual pace. ?If you have given birth before, you may notice a more rapid progression of contraction changes. ?Feeling pressure in the vaginal area. ?Your water breaking (rupture of membranes). This is when the sac of fluid that surrounds your baby breaks. Fluid leaking from your vagina may be clear or blood-tinged. Labor usually starts within 24 hours of your water breaking, but it may take longer to begin. ?Some people may feel a sudden gush of fluid; others may notice repeatedly damp underwear. ?Follow these instructions at home: ? ?When labor starts, or if your water breaks, call your health care provider or nurse care line. Based on your situation, they  will determine when you should go in for an exam. ?During early labor, you may be able to rest and manage symptoms at home. Some strategies to try at home include: ?Breathing and relaxation techniques. ?Taking a warm bath or shower. ?Listening to music. ?Using a heating pad on the lower back for pain. If directed, apply heat to the area as often as told by your health care  provider. Use the heat source that your health care provider recommends, such as a moist heat pack or a heating pad. ?Place a towel between your skin and the heat source. ?Leave the heat on for 20-30 minutes. ?Remove the heat if your skin turns bright red. This is especially important if you are unable to feel pain, heat, or cold. You have a greater risk of getting burned. ?Contact a health care provider if: ?Your labor has started. ?Your water breaks. ?You have nausea, vomiting, or diarrhea. ?Get help right away if: ?You have painful, regular contractions that are 5 minutes apart or less. ?Labor starts before you are [redacted] weeks along in your pregnancy. ?You have a fever. ?You have bright red blood coming from your vagina. ?You do not feel your baby moving. ?You have a severe headache with or without vision problems. ?You have chest pain or shortness of breath. ?These symptoms may represent a serious problem that is an emergency. Do not wait to see if the symptoms will go away. Get medical help right away. Call your local emergency services (911 in the [place] Do not drive yourself to the hospital. ?Summary ?Labor is your body's natural process of moving your baby and the placenta out of your uterus. ?The process of labor usually starts when your baby is full-term, between 48 and 40 weeks of pregnancy. ?When labor starts, or if your water breaks, call your health care provider or nurse care line. Based on your situation, they will determine when you should go in for an exam. ?This information is not intended to replace advice given to you by your health care provider. Make sure you discuss any questions you have with your health care provider. ?Document Revised: 03/09/2021 Document Reviewed: 03/09/2021 ?Elsevier Patient Education ? 2022 [employer]. ? ? ? ?Labor Induction ?Labor induction is when steps are taken to cause a pregnant woman to begin the labor process. Most women go into labor on their own between 37  weeks and 42 weeks of pregnancy. When this does not happen, or when there is a medical need for labor to begin, steps may be taken to induce, or bring on, labor. ?Labor induction causes a pregnant woman's uterus to contract. It also causes the cervix to soften (ripen), open (dilate), and thin out. Usually, labor is not induced before 39 weeks of pregnancy unless there is a medical reason to do so. ?When is labor induction considered? ?Labor induction may be right for you if: ?Your pregnancy lasts longer than 41 to 42 weeks. ?Your placenta is separating from your uterus (placental abruption). ?You have a rupture of membranes and your labor does not begin. ?You have health problems, like diabetes or high blood pressure (preeclampsia) during your pregnancy. ?Your baby has stopped growing or does not have enough amniotic fluid. ?Before labor induction begins, your health care provider will consider the following factors: ?Your medical condition and the baby's condition. ?How many weeks you have been pregnant. ?How mature the baby's lungs are. ?The condition of your cervix. ?The position of the baby. ?The size of your birth  canal. ?Tell a health care provider about: ?Any allergies you have. ?All medicines you are taking, including vitamins, herbs, eye drops, creams, and over-the-counter medicines. ?Any problems you or your family members have had with anesthetic medicines. ?Any surgeries you have had. ?Any blood disorders you have. ?Any medical conditions you have. ?What are the risks? ?Generally, this is a safe procedure. However, problems may occur, including: ?Failed induction. ?Changes in fetal heart rate, such as being too high, too low, or irregular (erratic). ?Infection in the mother or the baby. ?Increased risk of having a cesarean delivery. ?Breaking off (abruption) of the placenta from the uterus. This is rare. ?Rupture of the uterus. This is very rare. ?Your baby could fail to get enough blood flow or oxygen.  This can be life-threatening. ?When induction is needed for medical reasons, the benefits generally outweigh the risks. ?What happens during the procedure? ?During the procedure, your health care provider will use

## 2022-02-04 NOTE — Progress Notes (Signed)
ROB: Patient doing well. Was diagnosed with COVID infection last week. No other family sick. Had mild symptoms.  For third trimester cultures.  Planning to [facility], has not yet started HSV suppression as she notes she did not receive a prescription. Will submit, can start today. Discussed labor precautions. Reviewed IOL for obesity by 40 weeks. Will tentatively schedule for 4/12 at 0800. NST performed today was reviewed and was found to be reactive.  Continue recommended antenatal testing and prenatal care. ? ? ?NONSTRESS TEST INTERPRETATION ? ?INDICATIONS: Obesity ? ?FHR baseline: 145 bpm ?RESULTS:Reactive (with use of acoustic stimulator and PO juice intake) ?COMMENTS: uterine irritability with rare contraction ? ? ?PLAN: ?1. Continue fetal kick counts twice a day. ?2. Continue antepartum testing as scheduled-weekly ? ? ?

## 2022-02-04 NOTE — Progress Notes (Signed)
ROB: She is doing well today. She said she has been having some brain fog, having difficulty concentrating sometimes. ?

## 2022-02-06 LAB — GC/CHLAMYDIA PROBE AMP
Chlamydia trachomatis, NAA: NEGATIVE
Neisseria Gonorrhoeae by PCR: NEGATIVE

## 2022-02-06 LAB — STREP GP B NAA: Strep Gp B NAA: POSITIVE — AB

## 2022-02-07 ENCOUNTER — Encounter: Payer: Self-pay | Admitting: Obstetrics and Gynecology

## 2022-02-09 ENCOUNTER — Other Ambulatory Visit: Payer: Medicaid Other

## 2022-02-09 ENCOUNTER — Ambulatory Visit (INDEPENDENT_AMBULATORY_CARE_PROVIDER_SITE_OTHER): Payer: 59 | Admitting: Obstetrics and Gynecology

## 2022-02-09 ENCOUNTER — Encounter: Payer: Self-pay | Admitting: Obstetrics and Gynecology

## 2022-02-09 VITALS — BP 124/82 | HR 89 | Wt 268.7 lb

## 2022-02-09 DIAGNOSIS — O0993 Supervision of high risk pregnancy, unspecified, third trimester: Secondary | ICD-10-CM | POA: Diagnosis not present

## 2022-02-09 DIAGNOSIS — Z3A38 38 weeks gestation of pregnancy: Secondary | ICD-10-CM

## 2022-02-09 LAB — POCT URINALYSIS DIPSTICK OB
Bilirubin, UA: NEGATIVE
Blood, UA: NEGATIVE
Glucose, UA: NEGATIVE
Ketones, UA: NEGATIVE
Leukocytes, UA: NEGATIVE
Nitrite, UA: NEGATIVE
POC,PROTEIN,UA: NEGATIVE
Spec Grav, UA: 1.02 (ref 1.010–1.025)
Urobilinogen, UA: 0.2 E.U./dL
pH, UA: 6 (ref 5.0–8.0)

## 2022-02-09 LAB — FETAL NONSTRESS TEST

## 2022-02-09 NOTE — Progress Notes (Signed)
ROB. Patient states fetal movement along with pelvic pressure. She is having an NST today. Patient states no questions or concerns at this time.   ?

## 2022-02-09 NOTE — Progress Notes (Signed)
ROB: Patient continues to have hip pain and some sciatica.  Also states she has occasional contractions but not regular.  NST reactive today.  No contractions noted.  Patient scheduled for induction April 12. ?

## 2022-02-16 ENCOUNTER — Telehealth: Payer: Self-pay

## 2022-02-16 NOTE — Telephone Encounter (Signed)
RN called patient at 11. She was scheduled for Induction at 0600 in induction book but Dr. Valentino Saxon note states patient to arrive at 0800 on 4/12. After speaking with the patient, she states the Office called her and said that they had overbooked and she would need to come in on 4/13 at 0600. RN called Dr. Valentino Saxon and informed her that the patient will arrive tomorrow morning at 0600.  ?

## 2022-02-17 ENCOUNTER — Inpatient Hospital Stay
Admission: RE | Admit: 2022-02-17 | Discharge: 2022-02-20 | DRG: 784 | Disposition: A | Discharge status: Home / Self Care | Payer: Medicaid Other | Attending: Obstetrics and Gynecology | Admitting: Obstetrics and Gynecology

## 2022-02-17 ENCOUNTER — Encounter: Payer: Self-pay | Admitting: Obstetrics and Gynecology

## 2022-02-17 ENCOUNTER — Inpatient Hospital Stay: Payer: Medicaid Other | Admitting: Anesthesiology

## 2022-02-17 ENCOUNTER — Other Ambulatory Visit: Payer: Self-pay

## 2022-02-17 ENCOUNTER — Encounter
Admission: RE | Disposition: A | Discharge status: Home / Self Care | Payer: Self-pay | Source: Home / Self Care | Attending: Obstetrics and Gynecology

## 2022-02-17 DIAGNOSIS — Z302 Encounter for sterilization: Secondary | ICD-10-CM

## 2022-02-17 DIAGNOSIS — O9832 Other infections with a predominantly sexual mode of transmission complicating childbirth: Secondary | ICD-10-CM | POA: Diagnosis present

## 2022-02-17 DIAGNOSIS — O34211 Maternal care for low transverse scar from previous cesarean delivery: Secondary | ICD-10-CM | POA: Diagnosis present

## 2022-02-17 DIAGNOSIS — O4292 Full-term premature rupture of membranes, unspecified as to length of time between rupture and onset of labor: Secondary | ICD-10-CM | POA: Diagnosis present

## 2022-02-17 DIAGNOSIS — Z3A40 40 weeks gestation of pregnancy: Secondary | ICD-10-CM | POA: Diagnosis not present

## 2022-02-17 DIAGNOSIS — Z8616 Personal history of COVID-19: Secondary | ICD-10-CM | POA: Diagnosis not present

## 2022-02-17 DIAGNOSIS — O09293 Supervision of pregnancy with other poor reproductive or obstetric history, third trimester: Secondary | ICD-10-CM | POA: Diagnosis not present

## 2022-02-17 DIAGNOSIS — O99824 Streptococcus B carrier state complicating childbirth: Secondary | ICD-10-CM | POA: Diagnosis present

## 2022-02-17 DIAGNOSIS — O99214 Obesity complicating childbirth: Secondary | ICD-10-CM | POA: Diagnosis not present

## 2022-02-17 DIAGNOSIS — O480 Post-term pregnancy: Secondary | ICD-10-CM

## 2022-02-17 DIAGNOSIS — A6000 Herpesviral infection of urogenital system, unspecified: Secondary | ICD-10-CM | POA: Diagnosis present

## 2022-02-17 DIAGNOSIS — O4202 Full-term premature rupture of membranes, onset of labor within 24 hours of rupture: Secondary | ICD-10-CM | POA: Diagnosis not present

## 2022-02-17 DIAGNOSIS — Z3490 Encounter for supervision of normal pregnancy, unspecified, unspecified trimester: Principal | ICD-10-CM | POA: Diagnosis present

## 2022-02-17 DIAGNOSIS — Z412 Encounter for routine and ritual male circumcision: Secondary | ICD-10-CM | POA: Diagnosis not present

## 2022-02-17 LAB — CBC
HCT: 38.2 % (ref 36.0–46.0)
Hemoglobin: 12.8 g/dL (ref 12.0–15.0)
MCH: 27.5 pg (ref 26.0–34.0)
MCHC: 33.5 g/dL (ref 30.0–36.0)
MCV: 82.2 fL (ref 80.0–100.0)
Platelets: 308 10*3/uL (ref 150–400)
RBC: 4.65 MIL/uL (ref 3.87–5.11)
RDW: 13.4 % (ref 11.5–15.5)
WBC: 9 10*3/uL (ref 4.0–10.5)
nRBC: 0 % (ref 0.0–0.2)

## 2022-02-17 LAB — TYPE AND SCREEN
ABO/RH(D): O POS
Antibody Screen: NEGATIVE

## 2022-02-17 LAB — RPR: RPR Ser Ql: NONREACTIVE

## 2022-02-17 SURGERY — Surgical Case
Anesthesia: Spinal | Laterality: Bilateral

## 2022-02-17 MED ORDER — LACTATED RINGERS IV BOLUS
1000.0000 mL | Freq: Once | INTRAVENOUS | Status: AC
  Administered 2022-02-17: 1000 mL via INTRAVENOUS

## 2022-02-17 MED ORDER — FERROUS SULFATE 325 (65 FE) MG PO TABS
325.0000 mg | ORAL_TABLET | ORAL | Status: DC
  Administered 2022-02-18 – 2022-02-20 (×2): 325 mg via ORAL
  Filled 2022-02-17 (×3): qty 1

## 2022-02-17 MED ORDER — SOD CITRATE-CITRIC ACID 500-334 MG/5ML PO SOLN
30.0000 mL | ORAL | Status: AC
  Administered 2022-02-17: 30 mL via ORAL

## 2022-02-17 MED ORDER — LIDOCAINE HCL (PF) 1 % IJ SOLN: 30.0000 mL | INTRAMUSCULAR | Status: DC | PRN

## 2022-02-17 MED ORDER — LACTATED RINGERS IV SOLN
125.0000 mL/h | INTRAVENOUS | Status: DC
  Administered 2022-02-17: 125 mL/h via INTRAVENOUS

## 2022-02-17 MED ORDER — MORPHINE SULFATE (PF) 0.5 MG/ML IJ SOLN
INTRAMUSCULAR | Status: DC | PRN
  Administered 2022-02-17: .1 mg via EPIDURAL

## 2022-02-17 MED ORDER — CEFAZOLIN SODIUM-DEXTROSE 2-4 GM/100ML-% IV SOLN
INTRAVENOUS | Status: AC
Start: 2022-02-17 — End: 2022-02-18
  Filled 2022-02-17: qty 100

## 2022-02-17 MED ORDER — TRANEXAMIC ACID-NACL 1000-0.7 MG/100ML-% IV SOLN
INTRAVENOUS | Status: AC
  Filled 2022-02-17: qty 100

## 2022-02-17 MED ORDER — ACETAMINOPHEN 500 MG PO TABS
1000.0000 mg | ORAL_TABLET | Freq: Four times a day (QID) | ORAL | Status: DC
  Administered 2022-02-18 – 2022-02-19 (×5): 1000 mg via ORAL
  Filled 2022-02-17 (×5): qty 2

## 2022-02-17 MED ORDER — AMMONIA AROMATIC IN INHA
RESPIRATORY_TRACT | Status: AC
  Filled 2022-02-17: qty 10

## 2022-02-17 MED ORDER — SENNOSIDES-DOCUSATE SODIUM 8.6-50 MG PO TABS
2.0000 | ORAL_TABLET | Freq: Every day | ORAL | Status: DC
  Administered 2022-02-18 – 2022-02-20 (×3): 2 via ORAL
  Filled 2022-02-17 (×3): qty 2

## 2022-02-17 MED ORDER — PHENYLEPHRINE HCL-NACL 20-0.9 MG/250ML-% IV SOLN
INTRAVENOUS | Status: AC
  Filled 2022-02-17: qty 250

## 2022-02-17 MED ORDER — 0.9 % SODIUM CHLORIDE (POUR BTL) OPTIME
TOPICAL | Status: DC | PRN
Start: 2022-02-17 — End: 2022-02-17
  Administered 2022-02-17: 50 mL

## 2022-02-17 MED ORDER — ONDANSETRON HCL 4 MG/2ML IJ SOLN
4.0000 mg | Freq: Four times a day (QID) | INTRAMUSCULAR | Status: DC | PRN
  Administered 2022-02-17: 4 mg via INTRAVENOUS
  Filled 2022-02-17: qty 2

## 2022-02-17 MED ORDER — LIDOCAINE HCL (PF) 1 % IJ SOLN
INTRAMUSCULAR | Status: AC
  Filled 2022-02-17: qty 30

## 2022-02-17 MED ORDER — WITCH HAZEL-GLYCERIN EX PADS
1.0000 | MEDICATED_PAD | CUTANEOUS | Status: DC | PRN
Start: 2022-02-17 — End: 2022-02-20

## 2022-02-17 MED ORDER — PROMETHAZINE HCL 25 MG/ML IJ SOLN: 6.2500 mg | INTRAMUSCULAR | Status: DC | PRN

## 2022-02-17 MED ORDER — PRENATAL MULTIVITAMIN CH
1.0000 | ORAL_TABLET | Freq: Every day | ORAL | Status: DC
  Administered 2022-02-18 – 2022-02-19 (×2): 1 via ORAL
  Filled 2022-02-17 (×2): qty 1

## 2022-02-17 MED ORDER — ONDANSETRON HCL 4 MG/2ML IJ SOLN
INTRAMUSCULAR | Status: DC | PRN
  Administered 2022-02-17: 4 mg via INTRAVENOUS

## 2022-02-17 MED ORDER — MORPHINE SULFATE (PF) 0.5 MG/ML IJ SOLN
INTRAMUSCULAR | Status: AC
  Filled 2022-02-17: qty 10

## 2022-02-17 MED ORDER — DIPHENHYDRAMINE HCL 25 MG PO CAPS: 25.0000 mg | ORAL_CAPSULE | Freq: Four times a day (QID) | ORAL | Status: DC | PRN

## 2022-02-17 MED ORDER — LACTATED RINGERS IV SOLN
500.0000 mL | INTRAVENOUS | Status: DC | PRN
  Administered 2022-02-17: 500 mL via INTRAVENOUS

## 2022-02-17 MED ORDER — OXYTOCIN BOLUS FROM INFUSION: 333.0000 mL | Freq: Once | INTRAVENOUS | Status: DC

## 2022-02-17 MED ORDER — KETOROLAC TROMETHAMINE 30 MG/ML IJ SOLN
30.0000 mg | Freq: Four times a day (QID) | INTRAMUSCULAR | Status: AC
  Administered 2022-02-18 (×3): 30 mg via INTRAVENOUS
  Filled 2022-02-17 (×3): qty 1

## 2022-02-17 MED ORDER — MORPHINE SULFATE (PF) 2 MG/ML IV SOLN: 1.0000 mg | INTRAVENOUS | Status: DC | PRN

## 2022-02-17 MED ORDER — SENSORCAINE 0.25 % 300ML FOR PAIN PUMP OPTIME: INTRAMUSCULAR | Status: DC | PRN

## 2022-02-17 MED ORDER — OXYTOCIN-SODIUM CHLORIDE 30-0.9 UT/500ML-% IV SOLN
1.0000 m[IU]/min | INTRAVENOUS | Status: DC
  Administered 2022-02-17: 4 m[IU]/min via INTRAVENOUS
  Filled 2022-02-17: qty 500

## 2022-02-17 MED ORDER — GLYCOPYRROLATE 0.2 MG/ML IJ SOLN
INTRAMUSCULAR | Status: DC | PRN
  Administered 2022-02-17: .2 mg via INTRAVENOUS

## 2022-02-17 MED ORDER — SOD CITRATE-CITRIC ACID 500-334 MG/5ML PO SOLN: 30.0000 mL | ORAL | Status: DC | PRN

## 2022-02-17 MED ORDER — BUPIVACAINE LIPOSOME 1.3 % IJ SUSP
INTRAMUSCULAR | Status: DC | PRN
Start: 2022-02-17 — End: 2022-02-17
  Administered 2022-02-17: 20 mL

## 2022-02-17 MED ORDER — LACTATED RINGERS IV SOLN: INTRAVENOUS | Status: DC

## 2022-02-17 MED ORDER — SODIUM CHLORIDE 0.9 % IV SOLN
500.0000 mg | INTRAVENOUS | Status: AC
  Administered 2022-02-17: 500 mg via INTRAVENOUS
  Filled 2022-02-17: qty 500

## 2022-02-17 MED ORDER — BUTORPHANOL TARTRATE 1 MG/ML IJ SOLN
1.0000 mg | INTRAMUSCULAR | Status: DC | PRN
  Administered 2022-02-17: 1 mg via INTRAVENOUS
  Filled 2022-02-17: qty 1

## 2022-02-17 MED ORDER — OXYCODONE-ACETAMINOPHEN 5-325 MG PO TABS: 2.0000 | ORAL_TABLET | ORAL | Status: DC | PRN

## 2022-02-17 MED ORDER — FENTANYL CITRATE (PF) 100 MCG/2ML IJ SOLN
INTRAMUSCULAR | Status: DC | PRN
  Administered 2022-02-17: 15 ug via INTRAVENOUS

## 2022-02-17 MED ORDER — GABAPENTIN 300 MG PO CAPS
300.0000 mg | ORAL_CAPSULE | Freq: Two times a day (BID) | ORAL | Status: DC
  Administered 2022-02-18 – 2022-02-20 (×6): 300 mg via ORAL
  Filled 2022-02-17 (×6): qty 1

## 2022-02-17 MED ORDER — FENTANYL CITRATE (PF) 100 MCG/2ML IJ SOLN
25.0000 ug | INTRAMUSCULAR | Status: DC | PRN
  Administered 2022-02-18: 25 ug via INTRAVENOUS
  Filled 2022-02-17: qty 2

## 2022-02-17 MED ORDER — IBUPROFEN 600 MG PO TABS
600.0000 mg | ORAL_TABLET | Freq: Four times a day (QID) | ORAL | Status: DC
  Administered 2022-02-18 – 2022-02-20 (×6): 600 mg via ORAL
  Filled 2022-02-17 (×7): qty 1

## 2022-02-17 MED ORDER — TRAMADOL HCL 50 MG PO TABS: 100.0000 mg | ORAL_TABLET | Freq: Four times a day (QID) | ORAL | Status: DC | PRN

## 2022-02-17 MED ORDER — HYDROMORPHONE HCL 2 MG PO TABS
2.0000 mg | ORAL_TABLET | ORAL | Status: DC | PRN
  Administered 2022-02-18 – 2022-02-20 (×12): 2 mg via ORAL
  Filled 2022-02-17 (×12): qty 1

## 2022-02-17 MED ORDER — OXYTOCIN-SODIUM CHLORIDE 30-0.9 UT/500ML-% IV SOLN
2.5000 [IU]/h | INTRAVENOUS | Status: AC
  Administered 2022-02-18: 2.5 [IU]/h via INTRAVENOUS

## 2022-02-17 MED ORDER — OXYTOCIN-SODIUM CHLORIDE 30-0.9 UT/500ML-% IV SOLN
2.5000 [IU]/h | INTRAVENOUS | Status: DC
Start: 2022-02-17 — End: 2022-02-17
  Administered 2022-02-17: 30 [IU]/h via INTRAVENOUS
  Filled 2022-02-17: qty 1000

## 2022-02-17 MED ORDER — CARBOPROST TROMETHAMINE 250 MCG/ML IM SOLN
INTRAMUSCULAR | Status: AC
  Filled 2022-02-17: qty 1

## 2022-02-17 MED ORDER — DEXAMETHASONE SODIUM PHOSPHATE 10 MG/ML IJ SOLN
INTRAMUSCULAR | Status: DC | PRN
  Administered 2022-02-17: 8 mg via INTRAVENOUS

## 2022-02-17 MED ORDER — TERBUTALINE SULFATE 1 MG/ML IJ SOLN
0.2500 mg | Freq: Once | INTRAMUSCULAR | Status: DC | PRN
Start: 2022-02-17 — End: 2022-02-17

## 2022-02-17 MED ORDER — DIBUCAINE (PERIANAL) 1 % EX OINT: 1.0000 "application " | TOPICAL_OINTMENT | CUTANEOUS | Status: DC | PRN

## 2022-02-17 MED ORDER — FENTANYL CITRATE (PF) 100 MCG/2ML IJ SOLN
INTRAMUSCULAR | Status: AC
  Filled 2022-02-17: qty 2

## 2022-02-17 MED ORDER — BUPIVACAINE HCL 0.5 % IJ SOLN
INTRAMUSCULAR | Status: DC | PRN
  Administered 2022-02-17: 30 mL

## 2022-02-17 MED ORDER — BUPIVACAINE IN DEXTROSE 0.75-8.25 % IT SOLN
INTRATHECAL | Status: DC | PRN
  Administered 2022-02-17: 1.5 mL via INTRATHECAL

## 2022-02-17 MED ORDER — ZOLPIDEM TARTRATE 5 MG PO TABS: 5.0000 mg | ORAL_TABLET | Freq: Every evening | ORAL | Status: DC | PRN

## 2022-02-17 MED ORDER — MISOPROSTOL 200 MCG PO TABS
ORAL_TABLET | ORAL | Status: AC
  Filled 2022-02-17: qty 4

## 2022-02-17 MED ORDER — OXYCODONE-ACETAMINOPHEN 5-325 MG PO TABS: 1.0000 | ORAL_TABLET | ORAL | Status: DC | PRN

## 2022-02-17 MED ORDER — PHENYLEPHRINE HCL-NACL 20-0.9 MG/250ML-% IV SOLN
INTRAVENOUS | Status: DC | PRN
Start: 2022-02-17 — End: 2022-02-17
  Administered 2022-02-17: 50 ug/min via INTRAVENOUS

## 2022-02-17 MED ORDER — SODIUM CHLORIDE 0.9 % IV SOLN
2.0000 g | INTRAVENOUS | Status: AC
  Administered 2022-02-17: 2 g via INTRAVENOUS
  Filled 2022-02-17: qty 2

## 2022-02-17 MED ORDER — AMPICILLIN SODIUM 2 G IJ SOLR
2.0000 g | Freq: Once | INTRAMUSCULAR | Status: AC
  Administered 2022-02-17: 2 g via INTRAVENOUS
  Filled 2022-02-17: qty 2000

## 2022-02-17 MED ORDER — ACETAMINOPHEN 325 MG PO TABS: 650.0000 mg | ORAL_TABLET | ORAL | Status: DC | PRN

## 2022-02-17 MED ORDER — BUPIVACAINE HCL (PF) 0.5 % IJ SOLN
INTRAMUSCULAR | Status: AC
Start: 2022-02-17 — End: 2022-02-18
  Filled 2022-02-17: qty 30

## 2022-02-17 MED ORDER — MAGNESIUM HYDROXIDE 400 MG/5ML PO SUSP
30.0000 mL | ORAL | Status: DC | PRN
  Filled 2022-02-17: qty 30

## 2022-02-17 MED ORDER — MENTHOL 3 MG MT LOZG
1.0000 | LOZENGE | OROMUCOSAL | Status: DC | PRN
  Filled 2022-02-17: qty 9

## 2022-02-17 MED ORDER — SODIUM CHLORIDE (PF) 0.9 % IJ SOLN
INTRAMUSCULAR | Status: AC
  Filled 2022-02-17: qty 50

## 2022-02-17 MED ORDER — COCONUT OIL OIL
1.0000 "application " | TOPICAL_OIL | Status: DC | PRN
  Administered 2022-02-18: 1 via TOPICAL
  Filled 2022-02-17: qty 120

## 2022-02-17 MED ORDER — OXYTOCIN 10 UNIT/ML IJ SOLN
INTRAMUSCULAR | Status: DC
Start: 2022-02-17 — End: 2022-02-17
  Filled 2022-02-17: qty 2

## 2022-02-17 MED ORDER — AMPICILLIN SODIUM 1 G IJ SOLR
1.0000 g | INTRAMUSCULAR | Status: DC
  Administered 2022-02-17 (×2): 1 g via INTRAVENOUS
  Filled 2022-02-17 (×2): qty 1000

## 2022-02-17 MED ORDER — SOD CITRATE-CITRIC ACID 500-334 MG/5ML PO SOLN
ORAL | Status: AC
  Filled 2022-02-17: qty 15

## 2022-02-17 MED ORDER — SODIUM CHLORIDE 0.9 % IV SOLN
INTRAVENOUS | Status: AC
  Filled 2022-02-17: qty 5

## 2022-02-17 MED ORDER — SIMETHICONE 80 MG PO CHEW
80.0000 mg | CHEWABLE_TABLET | ORAL | Status: DC | PRN
Start: 2022-02-17 — End: 2022-02-20

## 2022-02-17 MED ORDER — BUPIVACAINE LIPOSOME 1.3 % IJ SUSP
INTRAMUSCULAR | Status: AC
  Filled 2022-02-17: qty 20

## 2022-02-17 SURGICAL SUPPLY — 30 items
BACTOSHIELD CHG 4% 4OZ (MISCELLANEOUS) ×1
BAG COUNTER SPONGE SURGICOUNT (BAG) ×2 IMPLANT
CHLORAPREP W/TINT 26 (MISCELLANEOUS) ×4 IMPLANT
DRSG TELFA 3X8 NADH (GAUZE/BANDAGES/DRESSINGS) ×2 IMPLANT
ELECT REM PT RETURN 9FT ADLT (ELECTROSURGICAL) ×2
ELECTRODE REM PT RTRN 9FT ADLT (ELECTROSURGICAL) ×1 IMPLANT
EXTRT SYSTEM ALEXIS 17CM (MISCELLANEOUS)
GAUZE SPONGE 4X4 12PLY STRL (GAUZE/BANDAGES/DRESSINGS) ×2 IMPLANT
GLOVE BIO SURGEON STRL SZ 6.5 (GLOVE) ×2 IMPLANT
GLOVE SURG UNDER LTX SZ7 (GLOVE) ×2 IMPLANT
GOWN STRL REUS W/ TWL LRG LVL3 (GOWN DISPOSABLE) ×2 IMPLANT
GOWN STRL REUS W/TWL LRG LVL3 (GOWN DISPOSABLE) ×2
KIT TURNOVER KIT A (KITS) ×2 IMPLANT
MANIFOLD NEPTUNE II (INSTRUMENTS) ×2 IMPLANT
MAT PREVALON FULL STRYKER (MISCELLANEOUS) ×2 IMPLANT
NS IRRIG 1000ML POUR BTL (IV SOLUTION) ×2 IMPLANT
PACK C SECTION AR (MISCELLANEOUS) ×2 IMPLANT
PAD DRESSING TELFA 3X8 NADH (GAUZE/BANDAGES/DRESSINGS) ×1 IMPLANT
PAD OB MATERNITY 4.3X12.25 (PERSONAL CARE ITEMS) ×2 IMPLANT
PAD PREP 24X41 OB/GYN DISP (PERSONAL CARE ITEMS) ×2 IMPLANT
RETRACTOR TRAXI PANNICULUS (MISCELLANEOUS) IMPLANT
SCRUB CHG 4% DYNA-HEX 4OZ (MISCELLANEOUS) ×1 IMPLANT
SUT MNCRL AB 4-0 PS2 18 (SUTURE) ×2 IMPLANT
SUT PLAIN 2 0 XLH (SUTURE) IMPLANT
SUT VIC AB 0 CT1 36 (SUTURE) ×8 IMPLANT
SUT VIC AB 3-0 SH 27 (SUTURE) ×1
SUT VIC AB 3-0 SH 27X BRD (SUTURE) ×1 IMPLANT
SYSTEM CONTND EXTRCTN KII BLLN (MISCELLANEOUS) IMPLANT
TRAXI PANNICULUS RETRACTOR (MISCELLANEOUS) ×1
WATER STERILE IRR 500ML POUR (IV SOLUTION) ×2 IMPLANT

## 2022-02-17 NOTE — Transfer of Care (Signed)
Immediate Anesthesia Transfer of Care Note ? ?Patient: Brandy Becker ? ?Procedure(s) Performed: CESAREAN SECTION WITH BILATERAL TUBAL LIGATION (Bilateral) ? ?Patient Location: PACU and OB High Risk ? ?Anesthesia Type:Spinal ? ?Level of Consciousness: awake ? ?Airway & Oxygen Therapy: Patient Spontanous Breathing ? ?Post-op Assessment: Report given to RN ? ?Post vital signs: stable ? ?Last Vitals:  ?Vitals Value Taken Time  ?BP    ?Temp    ?Pulse    ?Resp    ?SpO2    ? ? ?Last Pain:  ?Vitals:  ? 02/17/22 1920  ?TempSrc: Oral  ?PainSc:   ?   ? ?Patients Stated Pain Goal: 0 (02/17/22 [id number]) ? ?Complications: No notable events documented. ?

## 2022-02-17 NOTE — Progress Notes (Signed)
Anesthesia Dr. Erenest Rasher notified pt is a TOLAC and pitocin is being initiated for induction  ?

## 2022-02-17 NOTE — Anesthesia Preprocedure Evaluation (Signed)
Anesthesia Evaluation  ?Patient identified by MRN, date of birth, ID band ?Patient awake ? ? ? ?Reviewed: ?Allergy & Precautions, H&P , NPO status , Patient's Chart, lab work & pertinent test results, reviewed documented beta blocker date and time  ? ?Airway ?Mallampati: II ? ?TM Distance: >3 FB ?Neck ROM: full ? ? ? Dental ? ?(+) Dental Advidsory Given, Teeth Intact ?  ?Pulmonary ?neg pulmonary ROS,  ?  ?Pulmonary exam normal ?breath sounds clear to auscultation ? ? ? ? ? ? Cardiovascular ?Exercise Tolerance: Good ?negative cardio ROS ?Normal cardiovascular exam ?Rhythm:regular Rate:Normal ? ? ?  ?Neuro/Psych ?PSYCHIATRIC DISORDERS Anxiety Depression negative neurological ROS ?   ? GI/Hepatic ?Neg liver ROS, GERD  ,  ?Endo/Other  ?neg diabetesMorbid obesity ? Renal/GU ?negative Renal ROS  ?negative genitourinary ?  ?Musculoskeletal ? ? Abdominal ?  ?Peds ? Hematology ?negative hematology ROS ?(+)   ?Anesthesia Other Findings ?Past Medical History: ?No date: ADHD (attention deficit hyperactivity disorder) ?No date: Anxiety ?No date: Depression ?No date: Environmental allergies ?No date: GERD (gastroesophageal reflux disease) ?No date: History of chlamydia ?No date: History of multiple miscarriages ?    Comment:  x2 ?No date: Hx of herpes genitalis ?No date: Vaginal spotting ?    Comment:  currently pregnant ? ? Reproductive/Obstetrics ?(+) Pregnancy ? ?  ? ? ? ? ? ? ? ? ? ? ? ? ? ?  ?  ? ? ? ? ? ? ? ? ?Anesthesia Physical ?Anesthesia Plan ? ?ASA: 3 ? ?Anesthesia Plan: Spinal  ? ?Post-op Pain Management:   ? ?Induction:  ? ?PONV Risk Score and Plan: 2 ? ?Airway Management Planned:  ? ?Additional Equipment:  ? ?Intra-op Plan:  ? ?Post-operative Plan:  ? ?Informed Consent: I have reviewed the patients History and Physical, chart, labs and discussed the procedure including the risks, benefits and alternatives for the proposed anesthesia with the patient or authorized representative who  has indicated his/her understanding and acceptance.  ? ? ? ?Dental Advisory Given ? ?Plan Discussed with: Anesthesiologist, CRNA and Surgeon ? ?Anesthesia Plan Comments:   ? ? ? ? ? ? ?Anesthesia Quick Evaluation ? ?

## 2022-02-17 NOTE — H&P (Signed)
Obstetric History and Physical ? ?Brandy Becker is a 31 y.o. [phone] with IUP at [redacted]w[redacted]d presenting for scheduled IOL for obesity in pregnancy (BMI 44). Has a h/o C-section x 1 at 35 weeks desiring TOLAC, HSV on prophylaxis. Patient states she has been having  occasional contractions,  no vaginal bleeding, ruptured, clear fluid membranes since 0400 this morning, with active fetal movement.   ? ?Prenatal Course ?Source of Care: [facility] with onset of care at 10 weeks ?Pregnancy complications or risks: ?Patient Active Problem List  ? Diagnosis Date Noted  ? Encounter for planned induction of labor 03/13/22  ? COVID-19 affecting pregnancy in third trimester 02/04/2022  ? Desires VBAC (vaginal birth after cesarean) trial 02/04/2022  ? History of preterm delivery, currently pregnant in third trimester 02/04/2022  ? History of herpes simplex infection 12/28/2021  ? Anxiety and depression 04/10/2018  ? Vaginal spotting   ? History of multiple miscarriages   ? GERD (gastroesophageal reflux disease)   ? Hx of herpes genitalis   ? History of chlamydia   ? ?She plans to breastfeed ?She desires bilateral tubal ligation for postpartum contraception.  ? ?Prenatal labs and studies: ?ABO, Rh: --/--/PENDING 2023-03-14 0707) ?Antibody: PENDING 03-14-2023 7253) ?Rubella: 4.95 (09/16 6644) ?RPR: Non Reactive (01/25 1203)  ?HBsAg: Negative (09/16 0347)  ?HIV: Non Reactive (09/16 4259)  ?DGL:OVFIEPPI/-- (03/31 1010) ?1 hr Glucola  abnormal, 192.  3 hour GTT normal (1 hr elevated) ?Genetic screening normal ?Anatomy US normal ? ? ? ?Past Medical History:  ?Diagnosis Date  ? ADHD (attention deficit hyperactivity disorder)   ? Anxiety   ? Depression   ? Environmental allergies   ? GERD (gastroesophageal reflux disease)   ? History of chlamydia   ? History of multiple miscarriages   ? x2  ? Hx of herpes genitalis   ? Vaginal spotting   ? currently pregnant  ? ? ?Past Surgical History:  ?Procedure Laterality Date  ? CESAREAN SECTION   2009  ? DILATION AND EVACUATION N/A 07/08/2015  ? Procedure: DILATATION AND EVACUATION;  Surgeon: [name], MD;  Location: [facility] ORS;  Service: Gynecology;  Laterality: N/A;  ? TONSILLECTOMY    ? ? ?OB History  ?Gravida Para Term Preterm AB Living  ?6 1   1 4 1   ?SAB IAB Ectopic Multiple Live Births  ?2 2     1   ?  ?# Outcome Date GA Lbr Len/2nd Weight Sex Delivery Anes PTL Lv  ?6 Current           ?5 SAB 02/2015 [redacted]w[redacted]d       FD  ?4 SAB 08/20/08 [redacted]w[redacted]d       FD  ?3 Preterm 04/28/08 [redacted]w[redacted]d  3402 g M CS-Unspec  N LIV  ?   Complications: Failure to Progress in Second Stage  ?2 IAB           ?1 IAB           ? ? ?Social History  ? ?Socioeconomic History  ? Marital status: Single  ?  Spouse name: Not on file  ? Number of children: Not on file  ? Years of education: Not on file  ? Highest education level: Not on file  ?Occupational History  ? Occupation: makes coffee  ?Tobacco Use  ? Smoking status: Never  ? Smokeless tobacco: Never  ?Vaping Use  ? Vaping Use: Never used  ?Substance and Sexual Activity  ? Alcohol use: Not Currently  ? Drug use:  Not Currently  ?  Types: Marijuana  ? Sexual activity: Yes  ?  Partners: Female  ?  Birth control/protection: Surgical  ?Other Topics Concern  ? Not on file  ?Social History Narrative  ? Not on file  ? ?Social Determinants of Health  ? ?Financial Resource Strain: Not on file  ?Food Insecurity: Not on file  ?Transportation Needs: Not on file  ?Physical Activity: Not on file  ?Stress: Not on file  ?Social Connections: Not on file  ? ? ?Family History  ?Problem Relation Age of Onset  ? Hypertension Mother   ? Hyperlipidemia Mother   ? Multiple sclerosis Brother   ? Testicular cancer Brother   ? Seizures Son   ?     grew out of them  ? Cancer Maternal Grandmother   ? Esophageal cancer Maternal Grandfather   ? HIV Cousin   ? Lung cancer Other   ?     fob dad  ? Breast cancer Neg Hx   ? Ovarian cancer Neg Hx   ? Colon cancer Neg Hx   ? ? ?Medications Prior to Admission  ?Medication  Sig Dispense Refill Last Dose  ? Prenatal Vit-Fe Fumarate-FA (PRENATAL VITAMIN) 27-0.8 MG TABS Take 27 mg by mouth daily in the afternoon. 30 tablet 11 02/16/2022  ? valACYclovir (VALTREX) 500 MG tablet Take 1 tablet (500 mg total) by mouth 2 (two) times daily. 30 tablet 0 02/16/2022  ? ? ?Allergies  ?Allergen Reactions  ? Oxycodone Other (See Comments)  ?  Altered mental status  ? ? ?Review of Systems: Negative except for what is mentioned in HPI. ? ?Physical Exam: ?BP 131/67 (BP Location: Left Arm)   Pulse 87   Temp 98.3 ?F (36.8 ?C) (Oral)   Resp 17   Ht 5\' 5"  (1.651 m)   Wt 122.5 kg   LMP 05/13/2021 (Approximate)   BMI 44.93 kg/m?  ?CONSTITUTIONAL: Well-developed, well-nourished female in no acute distress.  ?HENT:  Normocephalic, atraumatic, External right and left ear normal. Oropharynx is clear and moist ?EYES: Conjunctivae and EOM are normal. Pupils are equal, round, and reactive to light. No scleral icterus.  ?NECK: Normal range of motion, supple, no masses ?SKIN: Skin is warm and dry. No rash noted. Not diaphoretic. No erythema. No pallor. ?NEUROLOGIC: Alert and oriented to person, place, and time. Normal reflexes, muscle tone coordination. No cranial nerve deficit noted. ?PSYCHIATRIC: Normal mood and affect. Normal behavior. Normal judgment and thought content. ?CARDIOVASCULAR: Normal heart rate noted, regular rhythm ?RESPIRATORY: Effort and breath sounds normal, no problems with respiration noted ?ABDOMEN: Soft, nontender, nondistended, gravid. ?MUSCULOSKELETAL: Normal range of motion. No edema and no tenderness. 2+ distal pulses. ? ?Cervical Exam: Dilatation 0.5 cm   Effacement thick%   Station -3   ?Presentation: cephalic ?FHT:  Baseline rate 145 bpm   Variability moderate  Accelerations present   Decelerations none ?Contractions: Irregular ?  ?Pertinent Labs/Studies:   ?Results for orders placed or performed during the hospital encounter of 02/17/22 (from the past 24 hour(s))  ?CBC     Status:  None  ? Collection Time: 02/17/22  7:07 AM  ?Result Value Ref Range  ? WBC 9.0 4.0 - 10.5 K/uL  ? RBC 4.65 3.87 - 5.11 MIL/uL  ? Hemoglobin 12.8 12.0 - 15.0 g/dL  ? HCT 38.2 36.0 - 46.0 %  ? MCV 82.2 80.0 - 100.0 fL  ? MCH 27.5 26.0 - 34.0 pg  ? MCHC 33.5 30.0 - 36.0 g/dL  ? RDW  13.4 11.5 - 15.5 %  ? Platelets 308 150 - 400 K/uL  ? nRBC 0.0 0.0 - 0.2 %  ?Type and screen [facility]     Status: None (Preliminary result)  ? Collection Time: 02/17/22  7:07 AM  ?Result Value Ref Range  ? ABO/RH(D) PENDING   ? Antibody Screen PENDING   ? Sample Expiration    ?  02/20/2022,2359 ?Performed at [facility], [address] [place], [place] [address] ?  ? ? ?Assessment : ?Elizebeth BrookingBecky Damon is a 31 y.o. [phone] at 5875w0d being admitted for induction of labor due to morbid obesity in pregnancy, PROM. History of C-section x 1, desiring TOLAC. H/o HSV on suppression. Previous preterm birth.  ? ?Plan: ?Labor:  Induction as ordered with Pitocin as per protocol. Analgesia as needed. ?FWB: Reassuring fetal heart tracing.  GBS positive, will treat with Ampicillin ?Delivery plan: Hopeful for vaginal delivery ? ? ? ?Hildred Laserherry, Karmyn Lowman, MD ?[facility] ? ? ? ? ?

## 2022-02-17 NOTE — Op Note (Signed)
Cesarean Section Procedure Note ? ?Indications: failed induction and previous uterine incision (low-transverse) x 1 ? ?Pre-operative Diagnosis: 40 week 0 day pregnancy with failed induction of labor, morbid obesity (BMI 44.9), h/o preterm delivery, history of C-section x 1 desiring TOLAC, PROM, undesired fertility. ? ?Post-operative Diagnosis: Same ? ?Surgeon: Hildred Laser, MD ? ?Assistants:  Doreene Burke, CNM.  An experienced assistant was required given the standard of surgical care given the complexity of the case.  This assistant was needed for exposure, dissection, suctioning, retraction, instrument exchange, and for overall help during the procedure. ? ?Procedure: Repeat low transverse Cesarean Section with Bilateral Tubal Ligation ? ?Anesthesia: Spinal anesthesia ? ?Findings: ?Female infant, cephalic presentation, 3910 grams, with Apgar scores of 9 at one minute and 9 at five minutes. ?Intact placenta with 3 vessel cord.  ?Clear amniotic fluid at hysterotomy ?The uterine outline, tubes and ovaries appeared normal.  ? ?Procedure Details: ?The patient was seen in the Holding Room. The risks, benefits, complications, treatment options, and expected outcomes were discussed with the patient.  The patient concurred with the proposed plan, giving informed consent.  The site of surgery properly noted/marked. The patient was taken to the Operating Room, identified as Brandy Becker and the procedure verified as C-Section Delivery. A Time Out was held and the above information confirmed. ? ?After induction of anesthesia, the patient was draped and prepped in the usual sterile manner. Anesthesia was tested and noted to be adequate. A Pfannenstiel incision was made and carried down through the subcutaneous tissue to the fascia. Fascial incision was made and extended transversely. The fascia was separated from the underlying rectus tissue superiorly and inferiorly. The peritoneum was identified and entered. Peritoneal  incision was extended longitudinally. The surgical assist was able to provide retraction to allow for clear visualization of surgical site. The utero-vesical peritoneal reflection was incised transversely and the bladder flap was bluntly freed from the lower uterine segment. A low transverse uterine incision was made. Delivered from cephalic presentation was a 3910 gram Female with Apgar scores of 9 at one minute and 9 at five minutes.  The assistant was able to apply adequate fundal pressure to allow for successful delivery of the fetus. After the umbilical cord was clamped and cut, cord blood was obtained for evaluation. The placenta was removed intact and appeared normal. The uterus was exteriorized and cleared of all clots and debris. The uterine outline, tubes and ovaries appeared normal.  The uterine incision was closed with running locked sutures of 0-Vicryl.  A second suture of 0-Vicryl was used in an imbricating layer.  Hemostasis was observed. ? ?Attention was then turned to the fallopian tubes, and where the patient's right fallopian tube was identified and grasped with a Babcock clamp.  The tube was then followed out to the fimbria.  The Babcock clamp was then used to grasp the tube approximately 4 cm from the cornual region.  A 3 cm segment of tube was then ligated with a free tie of 0-Chromic using the Park[place] method and excised.  The left fallopian tube was then ligated in a similar fashion and excised. The tubal lumens were cauterized bilaterally.  Good hemostasis was noted with bilateral fallopian tubes.  ? ? The uterus was then returned to the abdomen. The pericolic gutters were cleared of all clots and debris. The fascia was then grasped with Zannie Cove and Tresa Endo clamps, and injected with a total of 60 ml of 1.3% Exparel solution (20 ml of bupivicaine liposomal mixed with  30 ml of 0.5% Marcaine and diluted in 50 ml of normal saline).  The fascia was then reapproximated with a running suture of  0-Vicryl. The subcutaneous fat layer was reapproximated with 2-0 Vicryl. The skin was reapproximated with 4-0 Monocryl. The skin and subcutaneous tissues were then injected with an additional 40 ml of the Exparel solution. The incision was covered with steri-strips and a pressure dressing.  ? ?Instrument, sponge, and needle counts were correct prior the abdominal closure and at the conclusion of the case.  ? ? ?Estimated Blood Loss:  270 ml ?     ?Drains: foley catheter to gravity drainage, 300 ml of clear urine at end of the procedure ?        ?Total IV Fluids:  1150 ml ? ?Specimens: Segments of bilateral fallopian tubes.  ?        ?Implants: None ?        ?Complications:  None; patient tolerated the procedure well. ?        ?Disposition: PACU - hemodynamically stable. ?        ?Condition: stable ? ? ?Hildred Laser, MD ?[facility] ? ? ?

## 2022-02-17 NOTE — Anesthesia Procedure Notes (Signed)
Spinal ? ?Patient location during procedure: OB ?Start time: 02/17/2022 8:56 PM ?Reason for block: surgical anesthesia ?Staffing ?Performed: resident/CRNA  ?Preanesthetic Checklist ?Completed: patient identified, IV checked, site marked, risks and benefits discussed, surgical consent, monitors and equipment checked, pre-op evaluation and timeout performed ?Spinal Block ?Patient position: sitting ?Prep: DuraPrep ?Patient monitoring: heart rate, cardiac monitor, continuous pulse ox and blood pressure ?Approach: midline ?Location: L3-4 ?Injection technique: single-shot ?Needle ?Needle type: Sprotte  ?Needle gauge: 24 G ?Needle length: 9 cm ?Assessment ?Sensory level: T4 ?Events: CSF return ?Additional Notes ?Atraumatic attempt x1.  Negative heme, negative paresthesia, no pain with injection, free flow CSF pre/post injection.  Pt tolerated well. ? ? ? ?

## 2022-02-17 NOTE — Progress Notes (Signed)
Intrapartum Progress Note ? ?S: Patient tearful, notes that she no longer desires to continue with IOL.  ? ?O: Blood pressure (!) 152/79, pulse 94, temperature 98.7 ?F (37.1 ?C), temperature source Axillary, resp. rate 19, height 5\' 5"  (1.651 m), weight 122.5 kg, last menstrual period 05/13/2021, unknown if currently breastfeeding. ?Gen App: NAD, comfortable ?Abdomen: soft, gravid ?FHT: baseline 135 bpm.  Accels present.  Decels absent. moderate in degree variability.   ?Tocometer: contractions q 2-4 minutes ?Cervix: 1.5/40/-2 (exam per nurse) ?Extremities: Nontender, no edema. ? ?Pitocin: 10 mIU ? ?Labs: None ? ? ?Assessment:  ?1: SIUP at [redacted]w[redacted]d ?2. PROM ?3. H/o Cesarean delivery x 1, preterm at 72 week ?4. Obesity in pregnancy ?5. Undesired fertility.  ? ?Plan:  ?Patient now desiring to proceed with C-section.  The risks of cesarean section were discussed with the patient including but were not limited to: bleeding which may require transfusion or reoperation; infection which may require antibiotics; injury to bowel, bladder, ureters or other surrounding organs; injury to the fetus; need for additional procedures including hysterectomy in the event of a life-threatening hemorrhage; formation of adhesions; placental abnormalities wth subsequent pregnancies; incisional problems; thromboembolic phenomenon and other postoperative/anesthesia complications.  Patient also desires permanent sterilization.  Other reversible forms of contraception were discussed with patient; she declines all other modalities. This will be done either via bilateral salpingectomy or bilateral application of Filshie clips.  Risks of procedure discussed with patient including but not limited to: risk of regret, permanence of method, bleeding, infection, injury to surrounding organs and need for additional procedures.  Failure risk of about 1% with increased risk of ectopic gestation if pregnancy occurs was also discussed with patient.  Also  discussed possibility of post-tubal pain syndrome. The patient concurred with the proposed plan, giving informed written consent for the procedures.  Patient has been NPO since yesterday evening she will remain NPO for procedure. Anesthesia and OR aware.  Preoperative prophylactic antibiotics and SCDs ordered on call to the OR.  To OR when ready. ? ? ? ? ?Rubie Maid, MD ?02/17/2022 5:44 PM ? ? ?

## 2022-02-17 NOTE — Progress Notes (Signed)
Intrapartum Progress Note ? ?S: Patient breathing through contractions ? ?O: Blood pressure (!) 142/71, pulse 70, temperature 97.8 ?F (36.6 ?C), temperature source Oral, resp. rate 16, height 5\' 5"  (1.651 m), weight 122.5 kg, last menstrual period 05/13/2021, unknown if currently breastfeeding. ?Gen App: NAD, comfortable ?Abdomen: soft, gravid ?FHT: baseline 135 bpm.  Accels present.  Decels absent. moderate in degree variability.   ?Tocometer: contractions q 1-4 minutes ?Cervix: 1/30-40/-2 ?Extremities: Nontender, no edema. ? ?Pitocin: 8 mIU ? ?Labs:  ?Results for orders placed or performed during the hospital encounter of 02/17/22  ?CBC  ?Result Value Ref Range  ? WBC 9.0 4.0 - 10.5 K/uL  ? RBC 4.65 3.87 - 5.11 MIL/uL  ? Hemoglobin 12.8 12.0 - 15.0 g/dL  ? HCT 38.2 36.0 - 46.0 %  ? MCV 82.2 80.0 - 100.0 fL  ? MCH 27.5 26.0 - 34.0 pg  ? MCHC 33.5 30.0 - 36.0 g/dL  ? RDW 13.4 11.5 - 15.5 %  ? Platelets 308 150 - 400 K/uL  ? nRBC 0.0 0.0 - 0.2 %  ?RPR  ?Result Value Ref Range  ? RPR Ser Ql NON REACTIVE NON REACTIVE  ?Type and screen [facility]  ?Result Value Ref Range  ? ABO/RH(D) O POS   ? Antibody Screen NEG   ? Sample Expiration    ?  02/20/2022,2359 ?Performed at [facility], [address], [place], [place] ?  ? ? ? ? ?Assessment:  ?1: SIUP at [redacted]w[redacted]d, PROM ?2. H/o preterm delivery ?3. H/o C-section x 1 desiring TOLAC ?4. Obesity in pregnancy ? ?Plan:  ?1. Continue IOL with Pitocin ?2. Category 1 tracing ? ? ? ?[redacted]w[redacted]d, MD ?02/17/2022 1:33 PM ? ? ? ?

## 2022-02-18 ENCOUNTER — Encounter: Payer: Self-pay | Admitting: Obstetrics and Gynecology

## 2022-02-18 LAB — CBC
HCT: 33.5 % — ABNORMAL LOW (ref 36.0–46.0)
Hemoglobin: 11.2 g/dL — ABNORMAL LOW (ref 12.0–15.0)
MCH: 27.7 pg (ref 26.0–34.0)
MCHC: 33.4 g/dL (ref 30.0–36.0)
MCV: 82.9 fL (ref 80.0–100.0)
Platelets: 275 10*3/uL (ref 150–400)
RBC: 4.04 MIL/uL (ref 3.87–5.11)
RDW: 13.5 % (ref 11.5–15.5)
WBC: 12.7 10*3/uL — ABNORMAL HIGH (ref 4.0–10.5)
nRBC: 0 % (ref 0.0–0.2)

## 2022-02-18 MED ORDER — ONDANSETRON HCL 4 MG/2ML IJ SOLN: 4.0000 mg | Freq: Three times a day (TID) | INTRAMUSCULAR | Status: DC | PRN

## 2022-02-18 MED ORDER — NALOXONE HCL 4 MG/10ML IJ SOLN
1.0000 ug/kg/h | INTRAVENOUS | Status: DC | PRN
  Filled 2022-02-18: qty 5

## 2022-02-18 MED ORDER — DIPHENHYDRAMINE HCL 25 MG PO CAPS: 25.0000 mg | ORAL_CAPSULE | ORAL | Status: DC | PRN

## 2022-02-18 MED ORDER — KETOROLAC TROMETHAMINE 30 MG/ML IJ SOLN: 30.0000 mg | Freq: Four times a day (QID) | INTRAMUSCULAR | Status: AC | PRN

## 2022-02-18 MED ORDER — IBUPROFEN 600 MG PO TABS
ORAL_TABLET | ORAL | Status: AC
  Administered 2022-02-18: 600 mg
  Filled 2022-02-18: qty 1

## 2022-02-18 MED ORDER — HYDROCODONE-ACETAMINOPHEN 5-325 MG PO TABS: 1.0000 | ORAL_TABLET | ORAL | Status: DC | PRN

## 2022-02-18 MED ORDER — SODIUM CHLORIDE 0.9% FLUSH: 3.0000 mL | INTRAVENOUS | Status: DC | PRN

## 2022-02-18 MED ORDER — NALOXONE HCL 0.4 MG/ML IJ SOLN: 0.4000 mg | INTRAMUSCULAR | Status: DC | PRN

## 2022-02-18 MED ORDER — DIPHENHYDRAMINE HCL 50 MG/ML IJ SOLN: 12.5000 mg | INTRAMUSCULAR | Status: DC | PRN

## 2022-02-18 NOTE — Lactation Note (Signed)
This note was copied from a baby's chart. ?Lactation Consultation Note ? ?Patient Name: Boy Jude Linck ?Today's Date: 02/18/2022 ?Reason for consult: Initial assessment;Term ?Age:33 hours ? ?Maternal Data ?Does the patient have breastfeeding experience prior to this delivery?: Yes ?How long did the patient breastfeed?: 1 mth ? ?Feeding ?Mother's Current Feeding Choice: Breast Milk ?Baby was sleepy, not showing cues, had circ this am but did breastfeed for few minutes after, mom states baby is latching well  ?LATCH Score ?  ? ?  ? ?  ? ?  ? ?  ? ?  ? ? ?Lactation Tools Discussed/Used ? LC name and no written on white board, pumping discussed, instructed to call for assist with next feeding as needed ? ?Interventions ?Interventions: Breast feeding basics reviewed;Education ? ?Discharge ?Pump: Personal ?WIC Program: Yes ? ?Consult Status ?Consult Status: PRN ? ? ? ?Dyann Kief ?02/18/2022, 10:34 AM ? ? ? ?

## 2022-02-18 NOTE — Lactation Note (Signed)
This note was copied from a baby's chart. ?Lactation Consultation Note ? ?Patient Name: Brandy Becker ?Today's Date: 02/18/2022 ?Reason for consult: Follow-up assessment;Term ?Age:33 hours ? ?Maternal Data ?Has patient been taught Hand Expression?: Yes ? ?Feeding ?Mother's Current Feeding Choice: Breast Milk ?Assisted mom with waking baby to nurse, placed in cradle hold  on right breast, mom shown how to support breast and shape breast to get deep latch, sl tenderness at first latch but subsided with flanging upper lip and sl chin pressure,  currently baby nursing well with occ swallow, mom encouraged to offer left breast after right.  ?LATCH Score ?Latch: Grasps breast easily, tongue down, lips flanged, rhythmical sucking. ? ?Audible Swallowing: A few with stimulation ? ?Type of Nipple: Everted at rest and after stimulation ? ?Comfort (Breast/Nipple): Soft / non-tender ? ?Hold (Positioning): Assistance needed to correctly position infant at breast and maintain latch. ? ?LATCH Score: 8 ? ? ?Lactation Tools Discussed/Used ?  ? ?Interventions ?Interventions: Breast feeding basics reviewed;Assisted with latch;Skin to skin;Hand express;Support pillows;Breast compression;Coconut oil;Education ? ?Discharge ?  ? ?Consult Status ?Consult Status: Follow-up ?Date: 02/19/22 ?Follow-up type: In-patient ? ? ? ?Ferol Luz ?02/18/2022, 5:42 PM ? ? ? ?

## 2022-02-18 NOTE — Progress Notes (Signed)
Postpartum Day # 1: Cesarean Delivery (repeat) ? ?Subjective: ?Patient reports  ambulating, tolerating PO, + flatus, and no problems voiding.   ? ?Objective: ?Vital signs in last 24 hours: ?Temp:  [97.3 ?F (36.3 ?C)-98.7 ?F (37.1 ?C)] 97.6 ?F (36.4 ?C) (04/14 0910) ?Pulse Rate:  [69-94] 69 (04/14 0635) ?Resp:  [16-22] 18 (04/14 0910) ?BP: (96-152)/(52-90) 113/69 (04/14 0910) ?SpO2:  [94 %-99 %] 96 % (04/14 0700) ? ?Physical Exam:  ?General: alert and no distress ?Lungs: clear to auscultation bilaterally ?Breasts: normal appearance, no masses or tenderness ?Heart: regular rate and rhythm, S1, S2 normal, no murmur, click, rub or gallop ?Abdomen: soft, non-tender; bowel sounds normal; no masses,  no organomegaly ?Pelvis: Lochia appropriate, Uterine Fundus firm, Incision: pressure bandage in place, slight oozing of left lateral edge.  ?Extremities: DVT Evaluation: No evidence of DVT seen on physical exam. ?Negative Homan's sign. No cords or calf tenderness. No significant calf/ankle edema. ? ?Recent Labs  ?  02/17/22 ?0707 02/18/22 ?1610  ?HGB 12.8 11.2*  ?HCT 38.2 33.5*  ? ? ?Assessment/Plan: ?Status post Cesarean section. Doing well postoperatively.  ?Breastfeeding and Lactation consult ?Regular diet ?Continue PO pain management ?Contraception: tubal ligation intraoperatively ?Continue current care. Likely d/c home tomorrow. ? ?Hildred Laser, MD ?[facility] ? ? ? ? ? ?

## 2022-02-18 NOTE — Progress Notes (Signed)
Spoke with Dr. Valentino Saxon about pts drainage on dressing. Will change dressing to a pressure dressing. See new order.  ?

## 2022-02-18 NOTE — Anesthesia Postprocedure Evaluation (Signed)
Anesthesia Post Note ? ?Patient: Brandy Becker ? ?Procedure(s) Performed: CESAREAN SECTION WITH BILATERAL TUBAL LIGATION (Bilateral) ? ?Patient location during evaluation: Mother Baby ?Anesthesia Type: Spinal ?Level of consciousness: awake ?Pain management: pain level controlled ?Respiratory status: spontaneous breathing ?Cardiovascular status: stable ?Postop Assessment: no headache ?Anesthetic complications: no ? ? ?No notable events documented. ? ? ?Last Vitals:  ?Vitals:  ? 02/18/22 0635 02/18/22 0700  ?BP: (!) 107/57   ?Pulse: 69   ?Resp: 18   ?Temp: (!) 36.3 ?C   ?SpO2: 98% 96%  ?  ?Last Pain:  ?Vitals:  ? 02/18/22 0712  ?TempSrc:   ?PainSc: Asleep  ? ? ?  ?  ?  ?  ?  ?  ? ?Jaye Beagle ? ? ? ? ?

## 2022-02-19 MED ORDER — ACETAMINOPHEN 500 MG PO TABS
1000.0000 mg | ORAL_TABLET | Freq: Four times a day (QID) | ORAL | Status: DC
Start: 2022-02-19 — End: 2022-02-20
  Administered 2022-02-19 – 2022-02-20 (×4): 1000 mg via ORAL
  Filled 2022-02-19 (×6): qty 2

## 2022-02-19 NOTE — Progress Notes (Signed)
Progress Note - Cesarean Delivery ? ?Brandy Becker is a 31 y.o. [phone] now PP day 2 s/p C-Section, Low Transverse .  ? ?Subjective: ? Patient reports no problems with eating, bowel movements, voiding, or their wound ? She "is not ready to go home today" ? ?Objective: ? ?Vital signs in last 24 hours: ?Temp:  [97.5 ?F (36.4 ?C)-98.7 ?F (37.1 ?C)] 97.5 ?F (36.4 ?C) (04/15 0830) ?Pulse Rate:  [72-84] 83 (04/15 0830) ?Resp:  [17-20] 17 (04/15 0830) ?BP: (106-125)/(57-66) 120/66 (04/15 0830) ?SpO2:  [95 %-100 %] 97 % (04/15 0830) ? ?Physical Exam: ? ?General: alert, cooperative, and appears stated age ?Lochia: appropriate ?Uterine Fundus: firm ?Incision: Honeycomb intact ?   ?Data Review ?Recent Labs  ?  02/17/22 ?0707 02/18/22 ?1884  ?HGB 12.8 11.2*  ?HCT 38.2 33.5*  ? ? ?Assessment: ? Principal Problem: ?  Encounter for planned induction of labor ? ? Status post Cesarean section. Doing well postoperatively.  ?  ? ?Plan: ?      Continue current care. ?  ? ?Elonda Husky, M.D. ?02/19/2022 ?10:08 AM  ?

## 2022-02-19 NOTE — Lactation Note (Signed)
This note was copied from a baby's chart. ?Lactation Consultation Note ? ?Patient Name: Brandy Becker ?Today's Date: 02/19/2022 ?  ?Age:33 hours ?Lactation Rounds: ?LC to the room for a visit. Mother and baby are breastfeeding in football on the right. Nursing instructor had assisted her with positioning and noted a blister on the left nipple.  Mother states feeds are going well. LC assisted with positioning on the left, Mother was a little nervous due to previous damage. LC noted a compression stripe at the top of the nipple with small flattened blood blister. Baby positioned at breast height, tummy to mummy, nose to nipple. Encouraged to hold breast in a "U" shape and stroke nipple nose to chin. Baby opened with a wide gape and latched deep. LC reviewed and encouraged feeding on demand and with cues. If baby is not cueing we encourage hand expression or pumping to express milk to wake baby for feeds. Reviewed diaper counts for days of life and when to call Peds with questions. Reviewed "understanding Postpartum and Newborn care " booklet at bedside. Reviewed outpatient Lactation number and resources. Reviewed pacifier, pumping, and bottles are not encouraged until breastfeeding is established and going well in the first 4 weeks. Reviewed lactogenesis II and looking for mature milk to transition day 3-5.  Parents stated understanding with all teaching. [facility] # remains on board, encouraged to call with any questions.  ? ?LATCH Score ?Latch: Grasps breast easily, tongue down, lips flanged, rhythmical sucking. ? ?Audible Swallowing: Spontaneous and intermittent (initially required stim, then fed well with deeper latch) ? ?Type of Nipple: Everted at rest and after stimulation ? ?Comfort (Breast/Nipple): Filling, red/small blisters or bruises, mild/mod discomfort ? ?Hold (Positioning): Assistance needed to correctly position infant at breast and maintain latch. ? ?LATCH Score: 8 ?  ?Interventions ?Interventions:  Breast feeding basics reviewed;Assisted with latch;Breast massage;Hand express;Breast compression;Adjust position;Support pillows;Position options;Education ? ?Discharge ?Discharge Education: Engorgement and breast care;Warning signs for feeding baby ?Pump: Personal (has a hands free mom cozy) ?[place] Program: Yes ? ?Consult Status ?Consult Status: Complete ? ? ? ?Brandy Becker ?02/19/2022, 9:27 AM ? ? ? ?

## 2022-02-20 MED ORDER — IBUPROFEN 600 MG PO TABS: 600.0000 mg | ORAL_TABLET | Freq: Four times a day (QID) | ORAL | 0 refills | Status: DC

## 2022-02-20 MED ORDER — HYDROMORPHONE HCL 2 MG PO TABS: 2.0000 mg | ORAL_TABLET | ORAL | 0 refills | Status: DC | PRN

## 2022-02-20 NOTE — Lactation Note (Signed)
This note was copied from a baby's chart. ?Lactation Consultation Note ? ?Patient Name: Brandy Becker ?Today's Date: 02/20/2022 ?Reason for consult: Follow-up assessment;Mother's request ?Age:33 hours ?Lactation Rounds: ?LC to the room for a visit. Mother and baby are getting ready to feed.  Mother states she thought feeds were going well but baby has lost too much weight. LC placed info into NEWT tool and baby was just below the 50%/green line. LC reviewed and encouraged feeding on demand and with cues. If baby is not cueing we encourage hand expression and spoon feed to wake baby. Encouraged stimulation and breast compression when at breast. He often will fall asleep at breast. Mother's milk is transitioning in and breast are heavy and feeling full. Encouraged to continue supplementation after breast and encouraged to pump 8x's/24 hours until f/u with outpatient LC for weighted feeds. Gave and reviewed manual pump. Mother has a hands free pump and it is difficult to see the nipple movement in the equipment.  Reviewed diaper counts for days of life and when to call Peds with questions. Reviewed "understanding Postpartum and Newborn care " booklet at Mother stated understanding with all teaching.  ?  ?Feeding ?Mother's Current Feeding Choice: Breast Milk and Donor Milk ? ?LATCH Score ?Latch: Repeated attempts needed to sustain latch, nipple held in mouth throughout feeding, stimulation needed to elicit sucking reflex. ? ?Audible Swallowing: A few with stimulation ? ?Type of Nipple: Everted at rest and after stimulation ? ?Comfort (Breast/Nipple): Filling, red/small blisters or bruises, mild/mod discomfort ? ?Hold (Positioning): Assistance needed to correctly position infant at breast and maintain latch. ? ?LATCH Score: 6 ? ?Lactation Tools Discussed/Used ?Tools: Pump ?Breast pump type: Double-Electric Breast Pump;Other (comment);Manual (has a medcozy pump initiaed at bedside) ?Pump Education: Setup, frequency,  and cleaning;Milk Storage ?Reason for Pumping: only 2 voids and 1 stool in day 3 of life, and down -8.3% ?Pumping frequency: encourgaed to pump 8x's/24 hours ?Pumped volume: 2 mL ? ?Interventions ?Interventions: Breast feeding basics reviewed;Assisted with latch;Breast massage;Breast compression;Adjust position;Support pillows;Position options;Expressed milk;Education;Hand pump ? ?Discharge ?Discharge Education: Engorgement and breast care;Warning signs for feeding baby;Outpatient recommendation (following up with [place], encouraged [place] f/u) ?Pump: Manual;DEBP (hands free electric) ? ?Consult Status ?Consult Status: Complete ? ?Brandy Becker ?02/20/2022, 10:59 AM ? ? ? ?

## 2022-02-20 NOTE — Progress Notes (Signed)
Patient discharged with infant. Discharge instructions, prescriptions, and follow up appointments given to and reviewed with patient. Patient verbalized understanding. Will be escorted out by staff.  ?

## 2022-02-20 NOTE — Discharge Summary (Signed)
? ? ?Physician Obstetric Discharge Summary  ?Patient Name: Brandy Becker ?DOB: [birth year] ?MRN: [id number] ? ?                          Discharge Summary ? ?Date of Admission: 02/17/2022 ?Date of Discharge: 02/20/2022 ?Delivering Provider: Rubie Maid  ? ?Admitting Diagnosis: Encounter for planned induction of labor [Z34.90] at [redacted]w[redacted]d ?Secondary diagnosis:  Principal Problem: ?  Encounter for planned induction of labor ?  Failed induction ? ?Mode of Delivery:       low uterine, transverse ?    ?Discharge diagnosis: Term Pregnancy Delivered    ? ?Complications: none ? ?                   Discharge Day SOAP Note: ? ?Subjective: ? The patient has no complaints.  She is ambulating well. She is taking PO well. ?Pain is well controlled with current medications. ?Patient is urinating without difficulty.   ?She is passing flatus.  ?She desires discharge ? ?Objective ? ?Vital signs: ?BP 112/71 (BP Location: Left Arm)   Pulse 79   Temp 98.1 ?F (36.7 ?C) (Oral)   Resp 20   Ht 5\' 5"  (1.651 m)   Wt 122.5 kg   LMP 05/13/2021 (Approximate)   SpO2 97%   Breastfeeding Unknown   BMI 44.93 kg/m?  ? ?Physical Exam: ?Gen: NAD ?Abdomen:  ?clean, dry ?Fundus Fundal Tone: Firm  ?Lochia Amount: Scant  ?   ?Data Review ?Labs: ?Lab Results  ?Component Value Date  ? WBC 12.7 (H) 02/18/2022  ? HGB 11.2 (L) 02/18/2022  ? HCT 33.5 (L) 02/18/2022  ? MCV 82.9 02/18/2022  ? PLT 275 02/18/2022  ? ? ?  Latest Ref Rng & Units 02/18/2022  ?  5:39 AM 02/17/2022  ?  7:07 AM 12/01/2021  ? 12:03 PM  ?CBC  ?WBC 4.0 - 10.5 K/uL 12.7   9.0   7.9    ?Hemoglobin 12.0 - 15.0 g/dL 11.2   12.8   11.5    ?Hematocrit 36.0 - 46.0 % 33.5   38.2   34.9    ?Platelets 150 - 400 K/uL 275   308   288    ? ?O POS ? ?Edinburgh Score: ? ?  02/18/2022  ? 11:56 AM  ?Flavia Shipper Postnatal Depression Scale Screening Tool  ?I have been able to laugh and see the funny side of things. 1  ?I have looked forward with enjoyment to things. 0  ?I have blamed myself unnecessarily when  things went wrong. 0  ?I have been anxious or worried for no good reason. 2  ?I have felt scared or panicky for no good reason. 1  ?Things have been getting on top of me. 0  ?I have been so unhappy that I have had difficulty sleeping. 0  ?I have felt sad or miserable. 1  ?I have been so unhappy that I have been crying. 1  ?The thought of harming myself has occurred to me. 0  ?Edinburgh Postnatal Depression Scale Total 6  ? ? ?Assessment: ? Principal Problem: ?  Encounter for planned induction of labor ? ? Doing well.  Normal progress as expected. ? ?Plan: ? Discharge to home ? Modified rest as directed - may slowly resume normal activities with restrictions  as discussed. ? Medications as written. ? See below for additional. ?  ?   ?Discharge Instructions: Per After Visit Summary. ?Activity: Advance as tolerated. Pelvic rest  for 6 weeks.  Also refer to After Visit Summary.  Wound care discussed. ?Diet: Regular ?Medications: ?Allergies as of 02/20/2022   ? ?   Reactions  ? Oxycodone Other (See Comments)  ? Altered mental status  ? ?  ? ?  ?Medication List  ?  ? ?STOP taking these medications   ? ?valACYclovir 500 MG tablet ?Commonly known as: VALTREX ?  ? ?  ? ?TAKE these medications   ? ?HYDROmorphone 2 MG tablet ?Commonly known as: DILAUDID ?Take 1 tablet (2 mg total) by mouth every 4 (four) hours as needed for severe pain. ?  ?ibuprofen 600 MG tablet ?Commonly known as: ADVIL ?Take 1 tablet (600 mg total) by mouth every 6 (six) hours. ?  ?Prenatal Vitamin 27-0.8 MG Tabs ?Take 27 mg by mouth daily in the afternoon. ?  ? ?  ? ?Outpatient follow up:  ? Follow-up Information   ? ? Harlin Heys, MD Follow up in 1 week(s).   ?Specialties: Obstetrics and Gynecology, Radiology ?Contact information: ?[address] ?[address] ?[place] [address] ?[phone] ? ? ?  ?  ? ?  ?  ? ?  ? ?Postpartum contraception: Will discuss at first post-partum visit. ? ?Discharged Condition: good ? ?Discharged to:  home ? ?Newborn Data: ?Disposition:home with mother ? ?Apgars: APGAR (1 MIN): 9   ?APGAR (5 MINS): 9   ? ? ?Baby Feeding: Breast ? ?Finis Bud, M.D. ?02/20/2022 ?9:57 AM ? ?

## 2022-02-21 ENCOUNTER — Other Ambulatory Visit: Payer: Self-pay | Admitting: Obstetrics and Gynecology

## 2022-02-21 DIAGNOSIS — Z713 Dietary counseling and surveillance: Secondary | ICD-10-CM | POA: Diagnosis not present

## 2022-02-21 DIAGNOSIS — Z00110 Health examination for newborn under 8 days old: Secondary | ICD-10-CM | POA: Diagnosis not present

## 2022-02-21 LAB — SURGICAL PATHOLOGY

## 2022-03-02 ENCOUNTER — Encounter: Payer: Medicaid Other | Admitting: Obstetrics and Gynecology

## 2022-03-02 ENCOUNTER — Encounter: Payer: Self-pay | Admitting: Obstetrics and Gynecology

## 2022-03-03 ENCOUNTER — Encounter: Payer: Medicaid Other | Admitting: Obstetrics and Gynecology

## 2022-03-08 ENCOUNTER — Encounter: Payer: Self-pay | Admitting: Obstetrics and Gynecology

## 2022-03-08 ENCOUNTER — Ambulatory Visit (INDEPENDENT_AMBULATORY_CARE_PROVIDER_SITE_OTHER): Payer: Medicaid Other | Admitting: Obstetrics and Gynecology

## 2022-03-08 VITALS — BP 126/75 | HR 103 | Resp 16 | Ht 66.0 in | Wt 244.5 lb

## 2022-03-08 DIAGNOSIS — Z5189 Encounter for other specified aftercare: Secondary | ICD-10-CM

## 2022-03-08 DIAGNOSIS — Z1332 Encounter for screening for maternal depression: Secondary | ICD-10-CM

## 2022-03-08 NOTE — Progress Notes (Signed)
? ? ?  OBSTETRICS/GYNECOLOGY POST-OPERATIVE CLINIC VISIT ? ?Subjective:  ?  ? Brandy Becker is a 31 y.o. [phone] female who presents to the clinic  2  weeks status post  repeat C-section (low-transverse) with BTL for failure to progress, attempted TOLAC. Eating a regular diet without difficulty. Bowel movements are normal. The patient is not having any pain.  Thinks that her baby may have colic.  Is doing well with breastfeeding, notes she is actually overproducing. Edinburgh Depression screen was negative. Score is 0.  ? ? ?The following portions of the patient's history were reviewed and updated as appropriate: allergies, current medications, past family history, past medical history, past social history, past surgical history, and problem list. ? ?Review of Systems ?Pertinent items noted in HPI and remainder of comprehensive ROS otherwise negative. ?  ?Objective:  ? ?BP 126/75   Pulse (!) 103   Resp 16   Ht 5\' 6"  (1.676 m)   Wt 244 lb 8 oz (110.9 kg)   Breastfeeding Yes   BMI 39.46 kg/m?  Body mass index is 39.46 kg/m?. ? ?General:  alert and no distress  ?Abdomen: soft, bowel sounds active, non-tender  ?Incision:   healing well, no drainage, no erythema, no hernia, no seroma, no swelling, no dehiscence, incision well approximated.  Small amount of suture material noted on left lateral edge, trimmed today.   ? ? ? ?Pathology:  ? ?None ? ?Assessment:  ? ?Patient s/p repeat C-section (low transverse) with BTL ?Doing well postoperatively. ?Screen for maternal depression  ? ?Plan:  ? ?1. Continue any current medications as instructed by provider. ?2. Wound care discussed. Operative findings again reviewed. Pathology report discussed. ?3.  ?4. Activity restrictions: no bending, stooping, or squatting and no lifting more than 10-15 pounds ?5. Anticipated return to work: 4 weeks. ?6. Discussed tips to help with colic. Also advised to f/u with Pediatrician.  ?7. Follow up: 4 weeks for postpartum ? ? ? ? , MD ?[facility] ? ? ? ?  ?

## 2022-03-24 NOTE — Progress Notes (Signed)
   OBSTETRICS POSTPARTUM CLINIC PROGRESS NOTE  Subjective:     Brandy Becker is a 31 y.o. [phone] female who presents for a postpartum visit. She is 6 weeks postpartum following a Cesarean Section with Bilateral Tubal Ligation. I have fully reviewed the prenatal and intrapartum course. The delivery was at 40 gestational weeks.  Anesthesia: spinal. Postpartum course has been great. Baby's course has been good. Baby is feeding by breast. Bleeding: patient has not resumed menses, with No LMP recorded.. Bowel function is normal. Bladder function is normal. Patient is sexually active. Contraception method desired is tubal ligation. Postpartum depression screening: negative.  EDPS score is 7. Patient has been feeling foggy and dizzy at times states spells come and go and are irregular.    The following portions of the patient's history were reviewed and updated as appropriate: allergies, current medications, past family history, past medical history, past social history, past surgical history, and problem list.  Review of Systems Feeling week, sometimes has dizziness.  Notes tick bites on herself and baby.   Objective:    BP 102/60   Pulse 84   Ht 5\' 6"  (1.676 m)   Wt 241 lb 11.2 oz (109.6 kg)   BMI 39.01 kg/m   General:  alert and no distress   Breasts:  inspection negative, no nipple discharge or bleeding, no masses or nodularity palpable  Lungs: clear to auscultation bilaterally  Heart:  regular rate and rhythm, S1, S2 normal, no murmur, click, rub or gallop  Abdomen: soft, non-tender; bowel sounds normal; no masses,  no organomegaly.  Well healed Pfannenstiel incision  Pelvis:  Deferred  Rectal Exam: Not performed.  Extremities: Multiple small punctate bite sites on lower legs bilaterally         Labs:  Lab Results  Component Value Date   HGB 11.2 (L) 02/18/2022     Assessment:   1. Dizziness   2. Postpartum care following cesarean delivery   3. Flea bite of left lower leg,  initial encounter      Plan:    1. Contraception: tubal ligation 2. Will check Hgb for h/o postpartum anemia due to complaints of dizziness, and feeling foggy.  3. Flea bites, discussed washing sheets/clothes in hot water, topical ointments for symptom relief.  4. Follow up in:  3-4  months or as needed.    Rubie Maid, MD [facility]

## 2022-03-29 ENCOUNTER — Ambulatory Visit (INDEPENDENT_AMBULATORY_CARE_PROVIDER_SITE_OTHER): Payer: Medicaid Other | Admitting: Obstetrics and Gynecology

## 2022-03-29 ENCOUNTER — Encounter: Payer: Self-pay | Admitting: Obstetrics and Gynecology

## 2022-03-29 VITALS — BP 102/60 | HR 84 | Ht 66.0 in | Wt 241.7 lb

## 2022-03-29 DIAGNOSIS — R42 Dizziness and giddiness: Secondary | ICD-10-CM

## 2022-03-29 DIAGNOSIS — W57XXXA Bitten or stung by nonvenomous insect and other nonvenomous arthropods, initial encounter: Secondary | ICD-10-CM

## 2022-03-29 DIAGNOSIS — S80862A Insect bite (nonvenomous), left lower leg, initial encounter: Secondary | ICD-10-CM

## 2022-03-30 LAB — HEMOGLOBIN AND HEMATOCRIT, BLOOD
Hematocrit: 38.4 % (ref 34.0–46.6)
Hemoglobin: 12.8 g/dL (ref 11.1–15.9)

## 2022-03-31 ENCOUNTER — Encounter: Payer: Self-pay | Admitting: Obstetrics and Gynecology

## 2022-08-11 ENCOUNTER — Encounter: Payer: Self-pay | Admitting: Obstetrics and Gynecology

## 2022-08-15 ENCOUNTER — Encounter: Payer: Self-pay | Admitting: Obstetrics and Gynecology

## 2022-09-23 ENCOUNTER — Ambulatory Visit: Payer: Medicaid Other | Admitting: Obstetrics and Gynecology

## 2024-04-15 ENCOUNTER — Encounter: Payer: Self-pay | Admitting: Dietician

## 2024-04-15 ENCOUNTER — Encounter: Attending: Student | Admitting: Dietician

## 2024-04-15 VITALS — Ht 65.0 in | Wt 228.8 lb

## 2024-04-15 DIAGNOSIS — Z6838 Body mass index (BMI) 38.0-38.9, adult: Secondary | ICD-10-CM

## 2024-04-15 DIAGNOSIS — Z6841 Body Mass Index (BMI) 40.0 and over, adult: Secondary | ICD-10-CM | POA: Insufficient documentation

## 2024-04-15 DIAGNOSIS — Z713 Dietary counseling and surveillance: Secondary | ICD-10-CM | POA: Insufficient documentation

## 2024-04-15 DIAGNOSIS — R7303 Prediabetes: Secondary | ICD-10-CM | POA: Diagnosis present

## 2024-04-15 DIAGNOSIS — E66812 Obesity, class 2: Secondary | ICD-10-CM

## 2024-04-15 NOTE — Progress Notes (Signed)
 Medical Nutrition Therapy: Visit start time: 1000  end time: 1100  Assessment:   Referral Diagnosis: prediabetes, obesity Other medical history/ diagnoses: none significant Psychosocial issues/ stress concerns: none  Medications, supplements: reconciled list in medical record   Current weight: 228.8lbs Height: 5'5" BMI: 38.07   Progress and evaluation:  HbA1c 5.5% 03/07/24, 5.8% 09/2023  patient states she has sigificantly reduced restaurant eating, is now cooking more meals at home. Lives with 2 sons, oldest is 63. Sons participate in athletics year round. She wants to make sure they have healthy lifestyle habits also. Tried [employer] with family about 16 years ago, worked well due to family support.  Voices uncertainty in planning meals and achieving healthy balance.  Has been noticing some taste change, unsure if due to wegovy. Has not enjoyed salmon or eggs recently. Reports cravings for less-healthy foods ie fried foods, sweets during premenstrual days.  Reports some emotional eating prior to starting wegovy; with decreased appetite this has stopped, and also feels she is getting bored with some meals Food allergies: none Patient seeks help with reducing health risk   Dietary Intake:  Usual eating pattern includes 2-3 meals and 0-1 snacks per day. Who plans meals/ buys groceries? self Who prepares meals? Self  Breakfast: sometimes skips due to low appetite since childhood. Maybe avocado toast/ crackers/ something small/ light to take meds. Has developed some dislike of eggs Snack: none Lunch: 1-2pm meat + rice/ noodles + sometimes salad; varies -- sometimes eats at workplace dining hall  Snack: none Supper: chicken/ tuna/ steak once a week + rice + salad/ broccoli Snack: rarely something sweet ie ice cream Beverages: decaf coffee with honey or brown sugar, oat milk, water  Physical activity: gym workouts mostly weights, small amount cardio   Intervention:   Nutrition Care  Education:   Basic nutrition: basic food groups; appropriate nutrient balance; appropriate meal and snack schedule; general nutrition guidelines    Weight control: determining reasonable weight loss rate; importance of low sugar and low fat choices; portion control; estimated energy needs for weight loss at 1500 kcal, provided guidance for 45% CHO, 25% pro, and 30% fat; role of physical activity; managing food cravings with healthier alternatives prediabetes:  appropriate meal and snack schedule; appropriate and consistent carb intake and balance, healthy carb choices; role of fiber, protein, fat; physical activity   Other intervention notes: Patient has been making appropriate lifestyle changes, and is motivated to continue. Established additional goals for change with direction from patient.  No follow up needed at this time; patient to schedule later if circumstances change.   Nutritional Diagnosis:  Tea-2.1 Inpaired nutrition utilization and [place]-2.2 Altered nutrition-related laboratory As related to prediabetes.  As evidenced by history of elevated HbA1C, now controlled with medication and lifestyle changes. [place]-3.3 Overweight/obesity As related to history of excess calories and inadequate physical activity.  As evidenced by patient with current BMI of 38.   Education Materials given:  [employer] guidelines for Diabetes Plate Planner with food lists, sample meal pattern Visit summary with goals/ instructions   Learner/ who was taught:  Patient   Level of understanding: Verbalizes/ demonstrates competency  Demonstrated degree of understanding via:   Teach back Learning barriers: None  Willingness to learn/ readiness for change: Eager, change in progress  Monitoring and Evaluation:  Dietary intake, exercise, and body weight      follow up: prn

## 2024-04-15 NOTE — Patient Instructions (Signed)
 Decrease coffee drinks; try adding sugar free flavoring into water, lipton diet green tea Continue to make healthy food choices most of the time, great job so far! Plan ahead for a good balance in meals -- Protein + 1 cup (fist size) or less of starch + plenty of low-carb veggies. When craving something sweet, try fruit alone or with yogurt, small amount whipped cream, or sugar free pudding or jello; graham crackers can be a good substitute for cookies.  Keep up the regular exercise
